# Patient Record
Sex: Female | Born: 1935 | Race: Black or African American | Hispanic: No | Marital: Single | Smoking: Former smoker
Health system: Southern US, Community
[De-identification: ages and names within clinical notes are randomized; demographics above are authoritative.]

## PROBLEM LIST (undated history)

## (undated) DIAGNOSIS — H9190 Unspecified hearing loss, unspecified ear: Secondary | ICD-10-CM

## (undated) DIAGNOSIS — A630 Anogenital (venereal) warts: Secondary | ICD-10-CM

## (undated) DIAGNOSIS — H547 Unspecified visual loss: Secondary | ICD-10-CM

## (undated) DIAGNOSIS — K219 Gastro-esophageal reflux disease without esophagitis: Secondary | ICD-10-CM

## (undated) DIAGNOSIS — F32A Depression, unspecified: Secondary | ICD-10-CM

## (undated) DIAGNOSIS — J449 Chronic obstructive pulmonary disease, unspecified: Secondary | ICD-10-CM

## (undated) DIAGNOSIS — I10 Essential (primary) hypertension: Secondary | ICD-10-CM

## (undated) DIAGNOSIS — E559 Vitamin D deficiency, unspecified: Secondary | ICD-10-CM

## (undated) DIAGNOSIS — F329 Major depressive disorder, single episode, unspecified: Secondary | ICD-10-CM

## (undated) DIAGNOSIS — R413 Other amnesia: Principal | ICD-10-CM

## (undated) DIAGNOSIS — H409 Unspecified glaucoma: Secondary | ICD-10-CM

## (undated) DIAGNOSIS — N9089 Other specified noninflammatory disorders of vulva and perineum: Secondary | ICD-10-CM

## (undated) HISTORY — DX: Unspecified hearing loss, unspecified ear: H91.90

## (undated) HISTORY — DX: Anogenital (venereal) warts: A63.0

## (undated) HISTORY — DX: Gastro-esophageal reflux disease without esophagitis: K21.9

## (undated) HISTORY — DX: Other amnesia: R41.3

## (undated) HISTORY — DX: Unspecified visual loss: H54.7

## (undated) HISTORY — DX: Other specified noninflammatory disorders of vulva and perineum: N90.89

## (undated) HISTORY — DX: Chronic obstructive pulmonary disease, unspecified: J44.9

## (undated) HISTORY — DX: Major depressive disorder, single episode, unspecified: F32.9

## (undated) HISTORY — DX: Depression, unspecified: F32.A

## (undated) HISTORY — PX: CHOLECYSTECTOMY: SHX55

## (undated) HISTORY — DX: Essential (primary) hypertension: I10

## (undated) HISTORY — DX: Unspecified glaucoma: H40.9

## (undated) HISTORY — PX: EYE SURGERY: SHX253

## (undated) HISTORY — DX: Vitamin D deficiency, unspecified: E55.9

---

## 2007-01-16 ENCOUNTER — Encounter (INDEPENDENT_AMBULATORY_CARE_PROVIDER_SITE_OTHER): Payer: Self-pay | Admitting: Surgery

## 2007-01-16 ENCOUNTER — Ambulatory Visit (HOSPITAL_COMMUNITY): Admission: RE | Admit: 2007-01-16 | Discharge: 2007-01-17 | Payer: Self-pay | Admitting: Surgery

## 2007-10-16 ENCOUNTER — Ambulatory Visit (HOSPITAL_COMMUNITY): Admission: RE | Admit: 2007-10-16 | Discharge: 2007-10-16 | Payer: Self-pay | Admitting: Family Medicine

## 2009-04-11 ENCOUNTER — Emergency Department (HOSPITAL_COMMUNITY): Admission: EM | Admit: 2009-04-11 | Discharge: 2009-04-11 | Payer: Self-pay | Admitting: Emergency Medicine

## 2010-08-17 LAB — URINALYSIS, ROUTINE W REFLEX MICROSCOPIC
Bilirubin Urine: NEGATIVE
Glucose, UA: NEGATIVE mg/dL
Hgb urine dipstick: NEGATIVE
Specific Gravity, Urine: 1.009 (ref 1.005–1.030)
Urobilinogen, UA: 0.2 mg/dL (ref 0.0–1.0)

## 2010-08-17 LAB — POCT CARDIAC MARKERS: CKMB, poc: 1.9 ng/mL (ref 1.0–8.0)

## 2010-09-27 NOTE — Op Note (Signed)
NAMESYRIA, KESTNER             ACCOUNT NO.:  [id number]   MEDICAL RECORD NO.:  [id number]          PATIENT TYPE:  AMB   LOCATION:  DAY                          FACILITY:  [facility]   PHYSICIAN:  Thornton Park. Daphine Deutscher, MD  DATE OF BIRTH:  Oct 01, 1930   DATE OF PROCEDURE:  01/16/2007  DATE OF DISCHARGE:                               OPERATIVE REPORT   PREOPERATIVE DIAGNOSIS:  Chronic cholecystitis.   POSTOPERATIVE DIAGNOSIS:  Chronic cholecystitis, gigantic gallbladder  with Phrygian cap and filled with large faceted stones, and sludge in  the common bile duct.   PROCEDURE:  Laparoscopic cholecystectomy with intraoperative  cholangiogram.   SURGEON:  Thornton Park. Daphine Deutscher, M.D.   ASSISTANT:  Kristine Garbe. Ezzard Standing, M.D.   ANESTHESIA:  General endotracheal.   DESCRIPTION OF PROCEDURE:  Ms. Charlisa Cham was taken to room one on  the afternoon of September 3 and given general anesthesia.  The abdomen  was prepped with [employer] and draped sterilely.  A longitudinal  incision was made down on the umbilicus.  I entered the abdomen without  difficulty using Hassan technique.  The abdomen was insufflated. Three  trocars using [employer] trocars were placed in the upper abdomen.  The gallbladder was grasped and taken back up over the liver.  We had a  large redundancy and probably one of the longest and largest  gallbladders I have ever seen.  I dissected free Calot's triangle, put a  clip on the cystic duct, incised it, did a Reddick dynamic cholangiogram  showing some sludge which I subsequently milked back out.  I then triple  clipped the cystic duct, detached the gallbladder, clipped the cystic  artery, and removed it from the gallbladder bed with some difficulty  owing to its low redundancy and length.  It was flopped up on top of the  liver and was largely intrahepatic in that location.  I transected and  clipped a vessel along the back portion of the fundus.  No bleeding or  bile  leaks were seen.  The gallbladder was placed in a bag but it did  not completely fit in the bag as it was too big and I brought it out  through the umbilicus which I had enlarged with my incision.  The  umbilical port  was then repaired under laparoscopic vision with interrupted simple  sutures of  0 Prolene.  The abdomen was deflated. The wounds were closed  with 4-0 Vicryl, Benzoin, and Steri-Strips.  The patient tolerated the  procedure well and was taken to the recovery room in satisfactory  condition.      Thornton Park Daphine Deutscher, MD  Electronically Signed     MBM/MEDQ  D:  01/16/2007  T:  01/16/2007  Job:  [id number]   cc:   Windle Guard, M.D.  Fax: [phone]   Llana Aliment. Malon Kindle., M.D.  Fax: [phone]

## 2010-12-17 ENCOUNTER — Emergency Department (HOSPITAL_COMMUNITY): Payer: Medicare Other

## 2010-12-17 ENCOUNTER — Emergency Department (HOSPITAL_COMMUNITY)
Admission: EM | Admit: 2010-12-17 | Discharge: 2010-12-17 | Disposition: A | Discharge status: Home / Self Care | Payer: Medicare Other | Attending: Emergency Medicine | Admitting: Emergency Medicine

## 2010-12-17 DIAGNOSIS — F068 Other specified mental disorders due to known physiological condition: Secondary | ICD-10-CM | POA: Insufficient documentation

## 2010-12-17 DIAGNOSIS — Z7382 Dual sensory impairment: Secondary | ICD-10-CM | POA: Insufficient documentation

## 2010-12-17 DIAGNOSIS — H9190 Unspecified hearing loss, unspecified ear: Secondary | ICD-10-CM | POA: Insufficient documentation

## 2010-12-17 DIAGNOSIS — R42 Dizziness and giddiness: Secondary | ICD-10-CM | POA: Insufficient documentation

## 2010-12-17 DIAGNOSIS — R5381 Other malaise: Secondary | ICD-10-CM | POA: Insufficient documentation

## 2010-12-17 DIAGNOSIS — H543 Unqualified visual loss, both eyes: Secondary | ICD-10-CM | POA: Insufficient documentation

## 2010-12-17 DIAGNOSIS — I10 Essential (primary) hypertension: Secondary | ICD-10-CM | POA: Insufficient documentation

## 2010-12-17 DIAGNOSIS — F209 Schizophrenia, unspecified: Secondary | ICD-10-CM | POA: Insufficient documentation

## 2010-12-17 LAB — URINALYSIS, ROUTINE W REFLEX MICROSCOPIC
Glucose, UA: NEGATIVE mg/dL
Ketones, ur: NEGATIVE mg/dL
Leukocytes, UA: NEGATIVE
Protein, ur: NEGATIVE mg/dL
Urobilinogen, UA: 0.2 mg/dL (ref 0.0–1.0)

## 2010-12-17 LAB — CBC
HCT: 37.4 % (ref 36.0–46.0)
Hemoglobin: 12.7 g/dL (ref 12.0–15.0)
MCH: 30.1 pg (ref 26.0–34.0)
MCHC: 34 g/dL (ref 30.0–36.0)
MCV: 88.6 fL (ref 78.0–100.0)
RBC: 4.22 MIL/uL (ref 3.87–5.11)

## 2010-12-17 LAB — DIFFERENTIAL
Basophils Relative: 1 % (ref 0–1)
Lymphocytes Relative: 27 % (ref 12–46)
Lymphs Abs: 1.9 10*3/uL (ref 0.7–4.0)
Monocytes Absolute: 0.6 10*3/uL (ref 0.1–1.0)
Monocytes Relative: 8 % (ref 3–12)
Neutro Abs: 4.4 10*3/uL (ref 1.7–7.7)

## 2010-12-17 LAB — COMPREHENSIVE METABOLIC PANEL
BUN: 17 mg/dL (ref 6–23)
CO2: 28 mEq/L (ref 19–32)
Calcium: 10.1 mg/dL (ref 8.4–10.5)
Creatinine, Ser: 0.97 mg/dL (ref 0.50–1.10)
GFR calc Af Amer: >60 mL/min (ref >60)
GFR calc non Af Amer: 56 mL/min — ABNORMAL LOW (ref >60)
Glucose, Bld: 97 mg/dL (ref 70–99)
Total Protein: 7.5 g/dL (ref 6.0–8.3)

## 2010-12-17 LAB — LIPASE, BLOOD: Lipase: 14 U/L (ref 11–59)

## 2010-12-17 LAB — URINE MICROSCOPIC-ADD ON

## 2010-12-18 LAB — URINE CULTURE
Colony Count: NO GROWTH
Culture  Setup Time: 201208041755

## 2011-02-24 LAB — HEMOGLOBIN AND HEMATOCRIT, BLOOD
HCT: 37
Hemoglobin: 12.7

## 2011-02-24 LAB — BASIC METABOLIC PANEL
CO2: 24
Calcium: 10.1
Creatinine, Ser: 0.99
GFR calc Af Amer: >60
Sodium: 141

## 2012-01-11 ENCOUNTER — Other Ambulatory Visit: Payer: Self-pay

## 2012-07-31 ENCOUNTER — Ambulatory Visit: Payer: Self-pay | Admitting: Obstetrics & Gynecology

## 2012-08-14 ENCOUNTER — Ambulatory Visit (INDEPENDENT_AMBULATORY_CARE_PROVIDER_SITE_OTHER): Payer: Medicare Other | Admitting: Obstetrics & Gynecology

## 2012-08-14 ENCOUNTER — Encounter: Payer: Self-pay | Admitting: Obstetrics & Gynecology

## 2012-08-14 VITALS — BP 127/81 | HR 53 | Temp 99.1°F | Wt 128.0 lb

## 2012-08-14 DIAGNOSIS — A630 Anogenital (venereal) warts: Secondary | ICD-10-CM

## 2012-08-14 NOTE — Progress Notes (Signed)
Subjective:     Catherine Howell is a 79 y.o. female here for a routine exam.  Current complaints: none.       Obstetric History OB History   Grav Para Term Preterm Abortions TAB SAB Ect Mult Living                   The following portions of the patient's history were reviewed and updated as appropriate: allergies, current medications, past family history, past medical history, past social history, past surgical history and problem list.  Review of Systems Pertinent items are noted in HPI.    Objective:    Pelvic: Left perienum, nodule x 1-- 5 mm    Assessment:   H/O condylomata; essentially normal exam  Plan:    Follow up in: 6 months.

## 2012-08-14 NOTE — Patient Instructions (Signed)
Disorders of the Vulva It is important to know the outside (external) area of the female genitalia to properly examine yourself. The vulva or labia, sometimes also called the lips around the vagina, covers the pubic bone and surrounds the vagina. The larger or outer labia is called the labia majora. The inner or smaller labia is called the labia minora. The clitoris is at the top of the vulva. It is covered by a small area of tissue called the hood. Below the clitoris is the opening of the tube from the bladder, which is the urethral opening. Just below the urethral opening is the opening of the vagina. This area is called the vestibule. Inside the vestibule are bartholin and skene glands that lubricate the outside of the vagina during sexual intercourse. The perineum is the area that lies between the vagina and anus. You should examine your external genitalia once a month just as you do your self breast examination. SIGNS AND SYMPTOMS TO LOOK FOR DURING YOUR EXAMINATION:  Swelling.  Redness.  Bumps.  Blisters.  Black or white areas.  Itching.  Bleeding.  Burning. TYPES OF VULVAR PROBLEMS  Infections.  Yeast (fungus) is the most common infection that causes redness, swelling, itching and a thick white vaginal discharge. Women with diabetes, taking medicines that kill germs (antibiotics) or on birth control pills are at risk for yeast infection. This infection is treated with vaginal creams, suppositories and anti-itch cream for the vulva.  Genital warts (condyloma) are caused by the human papilloma virus. They are raised bumps that can itch, bleed, cause discomfort and sometimes appear in groups like cauliflower. This is a sexually transmitted disease (STD) caused by sexual contact. This can be treated with topical medication, freezing, electrocautery, laser or surgical removal.  Genital herpes is a virus that causes redness, swelling, blisters and ulcers that can cause itching and are  very painful. This is also a STD. There are medications to control the symptoms of herpes, but there is no cure. Once you have it, you keep getting it because the virus stays in your body.  Contact dermatitis. Contact dermatitis is an irritation of the vulva that can cause redness, swelling and itching. It can be caused by:  Perfumed toilet paper.  Deodorants.  Talcum powder.  Hygiene sprays.  Tampons.  Soaps and fabric softeners.  Wearing wet underwear and bathing suits too long.  Spermicide.  Condoms.  Diaphragms.  Poison ivy. Treatment will depend on eliminating the cause and treating the symptoms.  Vulvodynia.  Vulvodynia is "painful vulva." It includes burning, itching, irritation and a feeling of rawness or bruising of the vulva. Treating this problem depends on the cause and diagnosis. Treatment can take a long time.  Vulvar Dystrophy.  Vulvar Dystrophy is a disorder of the skin of the vulva. The skin can be too thick with hard raised patches or too thin skin showing wrinkles. Vulvar dystrophy can cause redness or white patches, itching and burning sensation. A biopsy may be needed to diagnose the problem. Treatment with creams or ointments depends on the diagnosis.  Vulvar Cancer.  Vulvar cancer is usually a form of squamus skin cancer. Other types of vulvar cancer like melenoma (a dark or black, irregular shaped mole that can bleed easily) and adenocarcinoma (red, itchy, scaly area that looks like eczema) are rare. Treatment is usually surgery to remove the cancerous area, the vulva and the lymph nodes in the groin. This can be done with or without radiation and chemotherapy. HOME   CARE INSTRUCTIONS   Look at your vulva and external genital area every month.  Follow and finish all treatment given to you by your caregiver.  Do not use perfumed or scented soaps, detergents, hygiene spray, talcum powder, tampons, douches or toilet paper.  Remove your tampon every 6  hours. Do not leave tampons in overnight.  Wear loose-fitting clothing.  Wear underwear that is cotton.  Avoid spermicidal creams or foams that irritate you. SEEK MEDICAL CARE IF:   You notice changes on your vulva such as redness, swelling, itching, color changes, bleeding, small bumps, blisters or any discomfort.  You develop an abnormal vaginal discharge.  You have painful sexual intercourse.  You notice swelling of the lymph nodes in your groin. Document Released: 10/18/2007 Document Revised: 07/24/2011 Document Reviewed: 08/01/2010 [employer] Patient Information 2013 [employer].  

## 2012-10-03 ENCOUNTER — Ambulatory Visit (INDEPENDENT_AMBULATORY_CARE_PROVIDER_SITE_OTHER): Payer: Medicare Other | Admitting: Neurology

## 2012-10-03 ENCOUNTER — Encounter: Payer: Self-pay | Admitting: Neurology

## 2012-10-03 VITALS — BP 124/70 | HR 61 | Ht <= 58 in | Wt 130.0 lb

## 2012-10-03 DIAGNOSIS — D518 Other vitamin B12 deficiency anemias: Secondary | ICD-10-CM

## 2012-10-03 DIAGNOSIS — R6889 Other general symptoms and signs: Secondary | ICD-10-CM

## 2012-10-03 DIAGNOSIS — R413 Other amnesia: Secondary | ICD-10-CM

## 2012-10-03 HISTORY — DX: Other amnesia: R41.3

## 2012-10-03 NOTE — Progress Notes (Signed)
Reason for visit: Memory disorder  Catherine Howell is a 79 y.o. female  History of present illness:  Catherine Howell is a 79 year old right-handed black female with a history of glaucoma associated with blindness. The patient has been in an assisted living situation since June of 2007. The patient herself has reported some issues with memory that dates back about 3 years. The patient has some problems remembering names for people or recent events. The patient requires assistance with most of her activities of daily living such as bathing or dressing. The patient is able to feed herself. The patient walks without a cane. The patient does have some problems with constipation and urinary incontinence. The patient denies headache, or numbness or weakness of the face, arms, or legs. The patient does report some dizziness. The patient is sent to this office for evaluation of her memory problems.  Past Medical History  Diagnosis Date  . Blind   . Condyloma acuminatum   . 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 Hypertension   . Glaucoma   . Memory deficits 10/03/2012  . 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 Hearing deficit   . Vitamin D deficiency   . Gastroesophageal reflux disease   . COPD (chronic obstructive pulmonary disease)   . Depression     Past Surgical History  Procedure Laterality Date  . Cholecystectomy    . Eye surgery      History reviewed. No pertinent family history.  Social history:  reports that she has quit smoking. She does not have any smokeless tobacco history on file. She reports that she does not drink alcohol or use illicit drugs.  Medications:  Current Outpatient Prescriptions on File Prior to Visit  Medication Sig Dispense Refill  . Acetaminophen 650 MG TABS Take 2 tablets by mouth every 12 (twelve) hours as needed (for pain).      Marland Kitchen albuterol (PROAIR HFA) 108 (90 BASE) MCG/ACT inhaler Inhale 2 puffs into the lungs every 4 (four)  hours as needed for wheezing.      Marland Kitchen aspirin 81 MG tablet Take 81 mg by mouth daily.      . beclomethasone (QVAR) 80 MCG/ACT inhaler Inhale 1-3 puffs into the lungs every 12 (twelve) hours.      . docusate calcium (SURFAK) 240 MG capsule Take 240 mg by mouth daily.      . haloperidol (HALDOL) 0.5 MG tablet Take 0.5 mg by mouth at bedtime.      Marland Kitchen lisinopril (PRINIVIL,ZESTRIL) 10 MG tablet Take 10 mg by mouth daily.      Marland Kitchen loteprednol (LOTEMAX) 0.5 % ophthalmic suspension Place 1 drop into the left eye daily.      . meclizine (ANTIVERT) 25 MG tablet Take 25 mg by mouth every 6 (six) hours as needed.      Marland Kitchen ofloxacin (OCUFLOX) 0.3 % ophthalmic solution Place 1 drop into both eyes 3 (three) times daily.      . ranitidine (ZANTAC) 300 MG tablet Take 300 mg by mouth at bedtime.      . sertraline (ZOLOFT) 50 MG tablet Take 50 mg by mouth at bedtime.      . sodium chloride (MURO 128) 5 % ophthalmic solution Place 1 drop into the left eye 3 (three) times daily.      . Vitamin D, Ergocalciferol, (DRISDOL) 50000 UNITS CAPS Take 50,000 Units by mouth every 14 (fourteen) days.      Marland Kitchen loratadine (  CLARITIN) 10 MG tablet Take 10 mg by mouth daily.      . Menthol-Methyl Salicylate (GRX ANALGESIC BALM) OINT Apply topically every 12 (twelve) hours as needed.       No current facility-administered medications on file prior to visit.    Allergies: No Known Allergies  ROS:  Out of a complete 14 system review of symptoms, the patient complains only of the following symptoms, and all other reviewed systems are negative.  Hearing loss, ringing in the ears Blindness, eye pain Incontinence of bladder Constipation Memory disturbance  Blood pressure 124/70, pulse 61, height 4\' 10"  (1.473 m), weight 130 lb (58.968 kg).  Physical Exam  General: The patient is alert and cooperative at the time of the examination.  Head: Pupils are round and reactive on the right, disc is flat. Not visualized on the  left.  Neck: The neck is supple, no carotid bruits are noted.  Respiratory: The respiratory examination is clear.  Cardiovascular: The cardiovascular examination reveals a regular rate and rhythm, no obvious murmurs or rubs are noted.  Skin: Extremities are without significant edema.  Neurologic Exam  Mental status: Mini-Mental status examination done today shows a total score of 18/25. The patient was not able to participate in some of the testing secondary to blindness. The patient is able to name 10 animals in 60 seconds.  Cranial nerves: Facial symmetry is present. Ptosis is noted on the left. There is good sensation of the face to pinprick and soft touch bilaterally. The strength of the facial muscles and the muscles to head turning and shoulder shrug are normal bilaterally. Speech is well enunciated, no aphasia or dysarthria is noted. Extraocular movements are full. The patient can only see some moving objects, she cannot count fingers.  Motor: The motor testing reveals 5 over 5 strength of all 4 extremities. Good symmetric motor tone is noted throughout.  Sensory: Sensory testing is intact to pinprick, soft touch, vibration sensation, and position sense on all 4 extremities. No evidence of extinction is noted.  Coordination: Cerebellar testing reveals good finger-nose-finger and heel-to-shin bilaterally.  Gait and station: Gait is slightly wide-based, unsteady. Tandem gait is not tested. Romberg is negative. No drift is seen.  Reflexes: Deep tendon reflexes are symmetric and normal bilaterally. Toes are downgoing bilaterally.   Assessment/Plan:  One. Reported memory disturbance  2. Blindness  The patient has noted some changes in her memory that have occurred over the last 3 years. The patient cognitively, however, is still doing fairly well. At this point, we will check blood work, and check a CT scan of the brain. The patient will be followed over time for her memory issues.  If significant progression is noted, medications for memory will be started. The patient will followup in 6 months.  Marlan Palau MD 10/04/2012 10:51 AM  [facility] [address] [place], [place] [address]  Phone [phone] Fax [phone]

## 2012-10-04 ENCOUNTER — Telehealth: Payer: Self-pay | Admitting: *Deleted

## 2012-10-04 LAB — RPR: RPR: NONREACTIVE

## 2012-10-04 NOTE — Telephone Encounter (Signed)
Message copied by Salome Spotted on Fri Oct 04, 2012  1:40 PM ------      Message from: Stephanie Acre      Created: Fri Oct 04, 2012  9:36 AM       Please call the patient. The blood work results are unremarkable. Thank you.            ----- Message -----         From: [facility] Lab Results In Interface         Sent: 10/04/2012   5:49 AM           To: York Spaniel, MD                   ------

## 2012-10-04 NOTE — Telephone Encounter (Signed)
Called patient left voice massage letting them know there results are ready. When call back send to pool.

## 2012-10-09 NOTE — Progress Notes (Signed)
Quick Note:  Called pt and spoke to [place], her caregiver and gave the results of labs (normal) to her. Fax to her at this home # and also to Dr. Jeannetta Nap. (fax [phone]). [phone]. Done. ______

## 2012-10-09 NOTE — Progress Notes (Signed)
Quick Note:  I called pt and spoke to [facility], her caregiver and gave the results of labs (normal) to her. Faxed to her at home # and also to Dr. Jeannetta Nap. Done. [phone]. ______

## 2012-10-16 ENCOUNTER — Telehealth: Payer: Self-pay | Admitting: Neurology

## 2012-10-16 ENCOUNTER — Ambulatory Visit
Admission: RE | Admit: 2012-10-16 | Discharge: 2012-10-16 | Disposition: A | Discharge status: Home / Self Care | Payer: Medicare Other | Source: Ambulatory Visit | Attending: Neurology | Admitting: Neurology

## 2012-10-16 DIAGNOSIS — R413 Other amnesia: Secondary | ICD-10-CM

## 2012-10-16 NOTE — Telephone Encounter (Signed)
I called the caretaker's. The CT of the brain shows minimal small vessel disease, right basal ganglia involvement. The small vessel disease is not extensive enough to result in significant memory issues.

## 2013-04-07 ENCOUNTER — Telehealth: Payer: Self-pay | Admitting: Neurology

## 2013-04-07 ENCOUNTER — Ambulatory Visit: Payer: Medicare Other | Admitting: Neurology

## 2013-04-07 NOTE — Telephone Encounter (Signed)
The patient did not show up for the appointment today. 

## 2013-12-11 ENCOUNTER — Encounter (HOSPITAL_COMMUNITY): Payer: Self-pay | Admitting: Emergency Medicine

## 2013-12-11 ENCOUNTER — Emergency Department (INDEPENDENT_AMBULATORY_CARE_PROVIDER_SITE_OTHER)
Admission: EM | Admit: 2013-12-11 | Discharge: 2013-12-11 | Disposition: A | Discharge status: Nursing Home (Includes ICF) | Payer: Medicare Other | Source: Home / Self Care | Attending: Family Medicine | Admitting: Family Medicine

## 2013-12-11 ENCOUNTER — Observation Stay (HOSPITAL_COMMUNITY)
Admission: EM | Admit: 2013-12-11 | Discharge: 2013-12-13 | Payer: Medicare Other | Attending: Internal Medicine | Admitting: Internal Medicine

## 2013-12-11 ENCOUNTER — Emergency Department (HOSPITAL_COMMUNITY): Payer: Medicare Other

## 2013-12-11 ENCOUNTER — Other Ambulatory Visit: Payer: Self-pay

## 2013-12-11 DIAGNOSIS — H409 Unspecified glaucoma: Secondary | ICD-10-CM | POA: Diagnosis present

## 2013-12-11 DIAGNOSIS — F03918 Unspecified dementia, unspecified severity, with other behavioral disturbance: Secondary | ICD-10-CM | POA: Insufficient documentation

## 2013-12-11 DIAGNOSIS — F0391 Unspecified dementia with behavioral disturbance: Secondary | ICD-10-CM

## 2013-12-11 DIAGNOSIS — K219 Gastro-esophageal reflux disease without esophagitis: Secondary | ICD-10-CM | POA: Diagnosis not present

## 2013-12-11 DIAGNOSIS — I10 Essential (primary) hypertension: Secondary | ICD-10-CM | POA: Diagnosis present

## 2013-12-11 DIAGNOSIS — F3289 Other specified depressive episodes: Secondary | ICD-10-CM | POA: Diagnosis not present

## 2013-12-11 DIAGNOSIS — E860 Dehydration: Principal | ICD-10-CM | POA: Diagnosis present

## 2013-12-11 DIAGNOSIS — H5440 Blindness, one eye, unspecified eye: Secondary | ICD-10-CM | POA: Diagnosis not present

## 2013-12-11 DIAGNOSIS — A630 Anogenital (venereal) warts: Secondary | ICD-10-CM

## 2013-12-11 DIAGNOSIS — G9341 Metabolic encephalopathy: Secondary | ICD-10-CM | POA: Diagnosis present

## 2013-12-11 DIAGNOSIS — Z79899 Other long term (current) drug therapy: Secondary | ICD-10-CM | POA: Insufficient documentation

## 2013-12-11 DIAGNOSIS — Z7982 Long term (current) use of aspirin: Secondary | ICD-10-CM | POA: Insufficient documentation

## 2013-12-11 DIAGNOSIS — J449 Chronic obstructive pulmonary disease, unspecified: Secondary | ICD-10-CM | POA: Diagnosis not present

## 2013-12-11 DIAGNOSIS — F22 Delusional disorders: Secondary | ICD-10-CM

## 2013-12-11 DIAGNOSIS — E559 Vitamin D deficiency, unspecified: Secondary | ICD-10-CM | POA: Diagnosis not present

## 2013-12-11 DIAGNOSIS — F329 Major depressive disorder, single episode, unspecified: Secondary | ICD-10-CM | POA: Insufficient documentation

## 2013-12-11 DIAGNOSIS — H547 Unspecified visual loss: Secondary | ICD-10-CM | POA: Diagnosis present

## 2013-12-11 DIAGNOSIS — R404 Transient alteration of awareness: Secondary | ICD-10-CM | POA: Diagnosis present

## 2013-12-11 DIAGNOSIS — R413 Other amnesia: Secondary | ICD-10-CM | POA: Diagnosis present

## 2013-12-11 DIAGNOSIS — J410 Simple chronic bronchitis: Secondary | ICD-10-CM

## 2013-12-11 DIAGNOSIS — J4489 Other specified chronic obstructive pulmonary disease: Secondary | ICD-10-CM | POA: Insufficient documentation

## 2013-12-11 DIAGNOSIS — R630 Anorexia: Secondary | ICD-10-CM

## 2013-12-11 DIAGNOSIS — R410 Disorientation, unspecified: Secondary | ICD-10-CM

## 2013-12-11 LAB — POCT URINALYSIS DIP (DEVICE)
GLUCOSE, UA: NEGATIVE mg/dL
KETONES UR: 80 mg/dL — AB
Leukocytes, UA: NEGATIVE
Nitrite: NEGATIVE
Protein, ur: 30 mg/dL — AB
Urobilinogen, UA: 1 mg/dL (ref 0.0–1.0)
pH: 5.5 (ref 5.0–8.0)

## 2013-12-11 LAB — COMPREHENSIVE METABOLIC PANEL
ALK PHOS: 73 U/L (ref 39–117)
ALT: 10 U/L (ref 0–35)
ANION GAP: 21 — AB (ref 5–15)
AST: 20 U/L (ref 0–37)
Albumin: 3.9 g/dL (ref 3.5–5.2)
BUN: 20 mg/dL (ref 6–23)
CHLORIDE: 101 meq/L (ref 96–112)
CO2: 19 meq/L (ref 19–32)
Calcium: 9.2 mg/dL (ref 8.4–10.5)
Creatinine, Ser: 0.97 mg/dL (ref 0.50–1.10)
GFR calc Af Amer: 62 mL/min — ABNORMAL LOW (ref >90)
GFR, EST NON AFRICAN AMERICAN: 54 mL/min — AB (ref >90)
Glucose, Bld: 75 mg/dL (ref 70–99)
POTASSIUM: 3.8 meq/L (ref 3.7–5.3)
Sodium: 141 mEq/L (ref 137–147)
Total Bilirubin: 0.8 mg/dL (ref 0.3–1.2)
Total Protein: 7.1 g/dL (ref 6.0–8.3)

## 2013-12-11 LAB — URINALYSIS, ROUTINE W REFLEX MICROSCOPIC
Glucose, UA: NEGATIVE mg/dL
Hgb urine dipstick: NEGATIVE
Ketones, ur: >80 mg/dL — AB
Leukocytes, UA: NEGATIVE
Nitrite: NEGATIVE
PH: 5.5 (ref 5.0–8.0)
PROTEIN: NEGATIVE mg/dL
Specific Gravity, Urine: 1.022 (ref 1.005–1.030)
Urobilinogen, UA: 1 mg/dL (ref 0.0–1.0)

## 2013-12-11 LAB — CBC
HEMATOCRIT: 39.2 % (ref 36.0–46.0)
HEMOGLOBIN: 12.6 g/dL (ref 12.0–15.0)
MCH: 30.3 pg (ref 26.0–34.0)
MCHC: 32.1 g/dL (ref 30.0–36.0)
MCV: 94.2 fL (ref 78.0–100.0)
Platelets: 156 10*3/uL (ref 150–400)
RBC: 4.16 MIL/uL (ref 3.87–5.11)
RDW: 12.7 % (ref 11.5–15.5)
WBC: 7.1 10*3/uL (ref 4.0–10.5)

## 2013-12-11 LAB — I-STAT CG4 LACTIC ACID, ED: Lactic Acid, Venous: 0.96 mmol/L (ref 0.5–2.2)

## 2013-12-11 LAB — AMMONIA: Ammonia: 24 umol/L (ref 11–60)

## 2013-12-11 LAB — CBG MONITORING, ED: GLUCOSE-CAPILLARY: 95 mg/dL (ref 70–99)

## 2013-12-11 MED ORDER — FLUTICASONE PROPIONATE HFA 44 MCG/ACT IN AERO
1.0000 | INHALATION_SPRAY | Freq: Two times a day (BID) | RESPIRATORY_TRACT | Status: DC
Start: 2013-12-11 — End: 2013-12-13
  Administered 2013-12-12: 1 via RESPIRATORY_TRACT
  Filled 2013-12-11: qty 10.6

## 2013-12-11 MED ORDER — SODIUM CHLORIDE 0.9 % IV BOLUS (SEPSIS)
500.0000 mL | Freq: Once | INTRAVENOUS | Status: AC
  Administered 2013-12-11: 500 mL via INTRAVENOUS

## 2013-12-11 MED ORDER — ENOXAPARIN SODIUM 40 MG/0.4ML ~~LOC~~ SOLN
40.0000 mg | Freq: Every day | SUBCUTANEOUS | Status: DC
  Administered 2013-12-12: 40 mg via SUBCUTANEOUS
  Filled 2013-12-11 (×3): qty 0.4

## 2013-12-11 MED ORDER — THIAMINE HCL 100 MG/ML IJ SOLN
100.0000 mg | Freq: Every day | INTRAMUSCULAR | Status: DC
  Administered 2013-12-12: 100 mg via INTRAVENOUS
  Filled 2013-12-11 (×2): qty 1

## 2013-12-11 MED ORDER — LOTEPREDNOL ETABONATE 0.5 % OP SUSP
1.0000 [drp] | Freq: Every day | OPHTHALMIC | Status: DC
  Administered 2013-12-12: 1 [drp] via OPHTHALMIC
  Filled 2013-12-11: qty 5

## 2013-12-11 MED ORDER — DOCUSATE SODIUM 100 MG PO CAPS
100.0000 mg | ORAL_CAPSULE | Freq: Every day | ORAL | Status: DC
  Administered 2013-12-12: 100 mg via ORAL
  Filled 2013-12-11 (×3): qty 1

## 2013-12-11 MED ORDER — HALOPERIDOL 0.5 MG PO TABS
0.5000 mg | ORAL_TABLET | Freq: Two times a day (BID) | ORAL | Status: DC
  Administered 2013-12-12 – 2013-12-13 (×3): 0.5 mg via ORAL
  Filled 2013-12-11 (×5): qty 1

## 2013-12-11 MED ORDER — SODIUM CHLORIDE (HYPERTONIC) 5 % OP SOLN
1.0000 [drp] | Freq: Three times a day (TID) | OPHTHALMIC | Status: DC
  Administered 2013-12-12 – 2013-12-13 (×3): 1 [drp] via OPHTHALMIC
  Filled 2013-12-11 (×3): qty 15

## 2013-12-11 MED ORDER — TRAZODONE HCL 100 MG PO TABS
100.0000 mg | ORAL_TABLET | Freq: Every day | ORAL | Status: DC
  Administered 2013-12-12: 100 mg via ORAL
  Filled 2013-12-11 (×3): qty 1

## 2013-12-11 MED ORDER — FAMOTIDINE 10 MG PO TABS
10.0000 mg | ORAL_TABLET | Freq: Every day | ORAL | Status: DC
  Administered 2013-12-12 – 2013-12-13 (×2): 10 mg via ORAL
  Filled 2013-12-11 (×2): qty 1

## 2013-12-11 MED ORDER — LISINOPRIL 5 MG PO TABS
5.0000 mg | ORAL_TABLET | Freq: Every day | ORAL | Status: DC
  Administered 2013-12-12: 5 mg via ORAL
  Filled 2013-12-11 (×2): qty 1

## 2013-12-11 MED ORDER — SODIUM CHLORIDE 0.9 % IV SOLN
INTRAVENOUS | Status: AC
  Administered 2013-12-11: 23:00:00 via INTRAVENOUS

## 2013-12-11 MED ORDER — SERTRALINE HCL 25 MG PO TABS
25.0000 mg | ORAL_TABLET | Freq: Every day | ORAL | Status: DC
  Administered 2013-12-12: 25 mg via ORAL
  Filled 2013-12-11 (×3): qty 1

## 2013-12-11 MED ORDER — LORATADINE 10 MG PO TABS
10.0000 mg | ORAL_TABLET | Freq: Every day | ORAL | Status: DC
  Administered 2013-12-12: 10 mg via ORAL
  Filled 2013-12-11 (×2): qty 1

## 2013-12-11 NOTE — Progress Notes (Signed)
Attempted to call. No answer phone just kept ringing.

## 2013-12-11 NOTE — H&P (Signed)
PCP: Kaleen Mask, MD    Chief Complaint:  Confused not eating  HPI: Catherine Howell is a 80 y.o. 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   Patient started to become progressively more disoriented for the past 2 weeks and then 6 days ago stopped eating and recently stopped drinking. She resides as Assisted living at [facility]. She is confused at baseline but able to do some things although needs assistance with ADL's. She has not been complaining of any pain. Patient has no family her guardian is at bedside. Her guardian states that the patient is able to ambulate. Her by mouth intake has somewhat improved that she has eaten a bowl sleep today. She is still very confused. She was taken to urgent care today for father workup. UA did not show any evidence of urinary tract, CT scan unremarkable For any new findings and chest x-ray also stable from prior.   Procardia and she had one loose bowel movement today but otherwise no diarrhea. No fever,  no other complaints.   Hospitalist was called for admission for dehydration and confusion.   Review of Systems:    unable to obtain due to confusion  Past Medical History: Past Medical History  Diagnosis Date  . Blind   . Condyloma acuminatum   . 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 Hypertension   . Glaucoma   . Memory deficits 10/03/2012  . 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 Hearing deficit   . Vitamin D deficiency   . Gastroesophageal reflux disease   . COPD (chronic obstructive pulmonary disease)   . Depression    Past Surgical History  Procedure Laterality Date  . Cholecystectomy    . Eye surgery        Medications: Prior to Admission medications   Medication Sig Start Date End Date Taking? Authorizing Provider  acetaminophen (TYLENOL) 500 MG tablet Take 500 mg by mouth 2 (two) times daily.   Yes Historical Provider, MD  albuterol (PROAIR HFA) 108 (90 BASE) MCG/ACT inhaler Inhale 2 puffs into the lungs every 4 (four) hours as needed for wheezing.   Yes Historical Provider, MD  aspirin 81 MG tablet Take 81 mg by mouth daily.   Yes Historical Provider, MD  beclomethasone (QVAR) 80 MCG/ACT inhaler Inhale 1-3 puffs into the lungs every 12 (twelve) hours.   Yes Historical Provider, MD  Cholecalciferol (D3-1000) 1000 UNITS tablet Take 1,000 Units by mouth daily.   Yes Historical Provider, MD  docusate calcium (SURFAK) 240 MG capsule Take 240 mg by mouth daily.   Yes Historical Provider, MD  haloperidol (HALDOL) 0.5 MG tablet Take 0.5 mg by mouth 2 (two) times daily.   Yes Historical Provider, MD  lisinopril (PRINIVIL,ZESTRIL) 5 MG tablet Take 5 mg by mouth daily.   Yes Historical Provider, MD  loratadine (CLARITIN) 10 MG tablet Take 10 mg by mouth daily.   Yes Historical Provider, MD  loteprednol (LOTEMAX) 0.5 % ophthalmic suspension Place 1 drop into the left eye daily.   Yes Historical Provider, MD  meclizine (ANTIVERT) 25 MG tablet Take 25 mg by mouth every 6 (six) hours as needed.   Yes Historical Provider, MD  Menthol-Methyl Salicylate (GRX ANALGESIC BALM) OINT Apply topically every  12 (twelve) hours as needed.   Yes Historical Provider, MD  PRAMOX-PE-GLYCERIN-PETROLATUM RE Place 1 application rectally 2 (two) times daily.   Yes Historical Provider, MD  ranitidine (ZANTAC) 300 MG tablet Take 300 mg by mouth at bedtime.   Yes Historical Provider, MD  sertraline (ZOLOFT) 25 MG tablet Take 25 mg by mouth at bedtime.   Yes Historical Provider, MD  sodium chloride (MURO 128) 5 % ophthalmic solution Place 1 drop into the left eye 3 (three) times daily.   Yes Historical Provider, MD  traZODone  (DESYREL) 100 MG tablet Take 100 mg by mouth at bedtime.   Yes Historical Provider, MD    Allergies:  No Known Allergies  Social History:  Ambulatory   independently  [facility] Assisted living  [facility] [place] [place] [phone]   reports that she has quit smoking. She does not have any smokeless tobacco history on file. She reports that she does not drink alcohol or use illicit drugs.    Family History: family history is not on file. She was adopted.    Physical Exam: Patient Vitals for the past 24 hrs:  BP Temp Temp src Pulse Resp SpO2  12/11/13 2130 131/85 mmHg - - - 16 -  12/11/13 2115 - - - - - 99 %  12/11/13 2100 133/96 mmHg - - 96 - -  12/11/13 2030 142/101 mmHg - - 79 - -  12/11/13 2002 129/86 mmHg - - 75 22 95 %  12/11/13 2000 129/86 mmHg - - - - -  12/11/13 1933 134/81 mmHg - - 73 20 100 %  12/11/13 1930 134/81 mmHg - - - - -  12/11/13 1845 135/77 mmHg - - 70 - 100 %  12/11/13 1815 123/73 mmHg - - 71 15 96 %  12/11/13 1800 132/75 mmHg - - 74 16 100 %  12/11/13 1745 114/65 mmHg - - 82 18 99 %  12/11/13 1700 115/60 mmHg - - 65 24 97 %  12/11/13 1621 131/61 mmHg - - 65 18 99 %  12/11/13 1521 129/75 mmHg 97.1 F (36.2 C) Oral 62 18 100 %    1. General:  in No Acute distress 2. Psychological: Alertbut not  Oriented 3. Head/ENT:     Dry Mucous Membranes                          Head Non traumatic, neck supple                           Poor Dentition but no visible abscess  Or loose teeth Left eyelid closed due to purulent discharge 4. SKIN:   decreased Skin turgor,  Skin clean Dry and intact no rash 5. Heart: Regular rate and rhythm no Murmur, Rub or gallop 6. Lungs: Clear to auscultation bilaterally, no wheezes or crackles   7. Abdomen: Soft, non-tender, Non distended 8. Lower extremities: no clubbing, cyanosis, or edema 9. Neurologically Grossly intact, moving all 4 extremities equally 10. MSK: Normal range of motion.    body mass index is unknown because there is no weight on file.   Labs on Admission:   Recent Labs  12/11/13 1522  NA 141  K 3.8  CL 101  CO2 19  GLUCOSE 75  BUN 20  CREATININE 0.97  CALCIUM 9.2    Recent Labs  12/11/13 1522  AST 20  ALT 10  ALKPHOS 73  BILITOT 0.8  PROT 7.1  ALBUMIN 3.9   No results found for this basename: LIPASE, AMYLASE,  in the last 72 hours  Recent Labs  12/11/13 1522  WBC 7.1  HGB 12.6  HCT 39.2  MCV 94.2  PLT 156   No results found for this basename: CKTOTAL, CKMB, CKMBINDEX, TROPONINI,  in the last 72 hours No results found for this basename: TSH, T4TOTAL, FREET3, T3FREE, THYROIDAB,  in the last 72 hours No results found for this basename: VITAMINB12, FOLATE, FERRITIN, TIBC, IRON, RETICCTPCT,  in the last 72 hours No results found for this basename: HGBA1C    The CrCl is unknown because both a height and weight (above a minimum accepted value) are required for this calculation. ABG No results found for this basename: phart, pco2, po2, hco3, tco2, acidbasedef, o2sat     No results found for this basename: DDIMER     Other results:  I have pearsonaly reviewed this: ECG REPORT  Rate: 69  Rhythm: RBBB ST&T Change: no ischemia UA no evidence of infection  BNP (last 3 results) No results found for this basename: PROBNP,  in the last 8760 hours  There were no vitals filed for this visit.   Cultures:    Component Value Date/Time   SDES URINE, CLEAN CATCH 12/17/2010 1113   SPECREQUEST clean catch 12/17/2010 1113   CULT NO GROWTH 12/17/2010 1113   REPTSTATUS 12/18/2010 FINAL 12/17/2010 1113    Radiological Exams on Admission: Ct Head Wo Contrast  12/11/2013   CLINICAL DATA:  Altered mental status.  Paranoia.  Disorientation.  EXAM: CT HEAD WITHOUT CONTRAST  TECHNIQUE: Contiguous axial images were obtained from the base of the skull through the vertex without intravenous contrast.  COMPARISON:  10/16/2012  FINDINGS: No mass  lesion. No midline shift. No acute hemorrhage or hematoma. No extra-axial fluid collections. No evidence of acute infarction. There is severe cerebellar atrophy and moderate cerebral cortical atrophy without ventricular dilatation. There are old bilateral lacunar infarcts in the basal ganglia. No acute abnormalities. Osseous structures are normal. Retention cysts are present in the bases of both maxillary sinuses.  IMPRESSION: No acute intracranial abnormality. Diffuse atrophy. Old lacunar infarcts.   Electronically Signed   By: Geanie Cooley M.D.   On: 12/11/2013 19:17   Dg Chest Portable 1 View  12/11/2013   CLINICAL DATA:  Altered mental status.  EXAM: PORTABLE CHEST - 1 VIEW  COMPARISON:  12/17/2010.  FINDINGS: Mediastinum and hilar structures normal. Lungs are clear. Elevation of the left hemidiaphragm is noted. This is stable. No pleural effusion or pneumothorax. No acute bony abnormality. Degenerative changes both shoulders.  IMPRESSION: 1. Elevation left hemidiaphragm.  This is stable. 2. No acute cardiopulmonary disease.   Electronically Signed   By: Maisie Fus  Register   On: 12/11/2013 17:08    Chart has been reviewed  Assessment/Plan  80 year old female with past medical history of mild hypertension cardiomyopathy medications and dementia presents with progressively worsening confusion poor by mouth intake resulting in dehydration.  Present on Admission:  . Encephalopathy, metabolic - will give gentle IV fluids and see if she improves. This is likely progressive process. No evidence of infection was noted. Spoke with legal guardian who at this point wishes to avoid over aggressive measures. Okay with IV fluids but does not wish for any aggressive interventions. Deboraha Sprang guardian stated she does not wish for feeding tube placement.  . Dehydration - we'll give gentle give fluids and see the  patient improves Dementia slowly progressing. Check TSH and vitamin B 12 - patient will likely need increased  level of care she is progressively unable to take care of herself  Prophylaxis:   Lovenox  CODE STATUS:   DNR/DNI concentrating on noninvasive care  Other plan as per orders.  I have spent a total of 55 min on this admission  Kensley Valladares 12/11/2013, 9:40 PM  [facility]  Pager [phone]   If 7AM-7PM, please contact the day team taking care of the patient  [email or web address]  Password [email or web address]

## 2013-12-11 NOTE — Progress Notes (Signed)
[place] received from [place].

## 2013-12-11 NOTE — ED Notes (Signed)
Patient transported to CT 

## 2013-12-11 NOTE — ED Notes (Signed)
Attempted to do I&O but was unable to get any urine return. Difficulty with anatomy on pt. MD and RN made aware.

## 2013-12-11 NOTE — ED Notes (Signed)
Pt was sent from urgent care with concern for uti.  Pt family reports from AL and usually alert and oriented.  Pt has been disoriented, paranoid, not eating, and wondering around which is not usual for her.  Pt denies any pain

## 2013-12-11 NOTE — ED Notes (Signed)
Phlebotomy at the bedside. ED tech attempted lab draw, unsuccessful. Resident made aware

## 2013-12-11 NOTE — ED Notes (Signed)
Pt may eat, family getting soup from [employer] for pt.

## 2013-12-11 NOTE — ED Provider Notes (Signed)
CSN: [id number]     Arrival date & time 12/11/13  1449 History   First MD Initiated Contact with Patient 12/11/13 1613     Chief Complaint  Patient presents with  . Altered Mental Status  . Paranoid     (Consider location/radiation/quality/duration/timing/severity/associated sxs/prior Treatment) Patient is a 80 y.o. female presenting with altered mental status. The history is provided by the patient and a relative.  Altered Mental Status Presenting symptoms: behavior changes, confusion and disorientation   Severity:  Severe Most recent episode:  More than 2 days ago Episode history:  Continuous Duration:  4 days Timing:  Constant Progression:  Unchanged Chronicity:  New Context: dementia and nursing home resident   Associated symptoms: no abdominal pain, no fever, no headaches, no nausea, no palpitations and no vomiting   Associated symptoms comment:  Decreased oral intake last four days    The patient is a 80 year old female presenting with delirium. Patient for the past 4 days not eating or drinking anything per her assisted living facility. Has been saying that there are people around who planting bombs. Patient went to urgent care with concern for potential UTI. Urine negative for UTI however very concentrated. Sent here for evaluation. Denies any fevers chills chest pain shortness of breath denies any head injuries.  Past Medical History  Diagnosis Date  . Blind   . Condyloma acuminatum   . 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 Hypertension   . Glaucoma   . Memory deficits 10/03/2012  . 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 Hearing deficit   . Vitamin D deficiency   . Gastroesophageal reflux disease   . COPD (chronic obstructive pulmonary disease)   . Depression    Past Surgical History  Procedure Laterality Date  . Cholecystectomy    . Eye surgery     No family history on file. History  Substance Use Topics  . Smoking status:  Former Games developer  . Smokeless tobacco: Not on file  . Alcohol Use: No   OB History   Grav Para Term Preterm Abortions TAB SAB Ect Mult Living                 Review of Systems  Constitutional: Negative for fever and chills.  HENT: Negative for congestion and rhinorrhea.   Eyes: Negative for redness and visual disturbance.  Respiratory: Negative for shortness of breath and wheezing.   Cardiovascular: Negative for chest pain and palpitations.  Gastrointestinal: Negative for nausea, vomiting and abdominal pain.  Genitourinary: Negative for dysuria and urgency.  Musculoskeletal: Negative for arthralgias and myalgias.  Skin: Negative for pallor and wound.  Neurological: Negative for dizziness and headaches.  Psychiatric/Behavioral: Positive for confusion.      Allergies  Review of patient's allergies indicates no known allergies.  Home Medications   Prior to Admission medications   Medication Sig Start Date End Date Taking? Authorizing Provider  acetaminophen (TYLENOL) 500 MG tablet Take 500 mg by mouth 2 (two) times daily.   Yes Historical Provider, MD  albuterol (PROAIR HFA) 108 (90 BASE) MCG/ACT inhaler Inhale 2 puffs into the lungs every 4 (four) hours as needed for wheezing.   Yes Historical Provider, MD  aspirin 81 MG tablet Take 81 mg by mouth daily.   Yes Historical Provider, MD  beclomethasone (QVAR) 80 MCG/ACT inhaler Inhale 1-3 puffs into the lungs every 12 (twelve) hours.   Yes Historical Provider, MD  Cholecalciferol (D3-1000) 1000 UNITS tablet  Take 1,000 Units by mouth daily.   Yes Historical Provider, MD  docusate calcium (SURFAK) 240 MG capsule Take 240 mg by mouth daily.   Yes Historical Provider, MD  haloperidol (HALDOL) 0.5 MG tablet Take 0.5 mg by mouth 2 (two) times daily.   Yes Historical Provider, MD  lisinopril (PRINIVIL,ZESTRIL) 5 MG tablet Take 5 mg by mouth daily.   Yes Historical Provider, MD  loratadine (CLARITIN) 10 MG tablet Take 10 mg by mouth daily.    Yes Historical Provider, MD  loteprednol (LOTEMAX) 0.5 % ophthalmic suspension Place 1 drop into the left eye daily.   Yes Historical Provider, MD  meclizine (ANTIVERT) 25 MG tablet Take 25 mg by mouth every 6 (six) hours as needed.   Yes Historical Provider, MD  Menthol-Methyl Salicylate (GRX ANALGESIC BALM) OINT Apply topically every 12 (twelve) hours as needed.   Yes Historical Provider, MD  PRAMOX-PE-GLYCERIN-PETROLATUM RE Place 1 application rectally 2 (two) times daily.   Yes Historical Provider, MD  ranitidine (ZANTAC) 300 MG tablet Take 300 mg by mouth at bedtime.   Yes Historical Provider, MD  sertraline (ZOLOFT) 25 MG tablet Take 25 mg by mouth at bedtime.   Yes Historical Provider, MD  sodium chloride (MURO 128) 5 % ophthalmic solution Place 1 drop into the left eye 3 (three) times daily.   Yes Historical Provider, MD  traZODone (DESYREL) 100 MG tablet Take 100 mg by mouth at bedtime.   Yes Historical Provider, MD   BP 142/101  Pulse 79  Temp(Src) 97.1 F (36.2 C) (Oral)  Resp 22  SpO2 95% Physical Exam  Constitutional: She is oriented to person, place, and time. She appears well-developed and well-nourished. No distress.  HENT:  Head: Normocephalic and atraumatic.  Eyes: EOM are normal. Pupils are equal, round, and reactive to light.  Neck: Normal range of motion. Neck supple.  Cardiovascular: Normal rate and regular rhythm.  Exam reveals no gallop and no friction rub.   No murmur heard. Pulmonary/Chest: Effort normal. She has no wheezes. She has no rales.  Abdominal: Soft. She exhibits no distension. There is no tenderness.  Musculoskeletal: She exhibits no edema and no tenderness.  Neurological: She is alert and oriented to person, place, and time.  Skin: Skin is warm and dry. She is not diaphoretic.  Psychiatric: She has a normal mood and affect. Her behavior is normal.    ED Course  Procedures (including critical care time) Labs Review Labs Reviewed  COMPREHENSIVE  METABOLIC PANEL - Abnormal; Notable for the following:    GFR calc non Af Amer 54 (*)    GFR calc Af Amer 62 (*)    Anion gap 21 (*)    All other components within normal limits  URINALYSIS, ROUTINE W REFLEX MICROSCOPIC - Abnormal; Notable for the following:    Bilirubin Urine MODERATE (*)    Ketones, ur >80 (*)    All other components within normal limits  CBC  AMMONIA  CBG MONITORING, ED  I-STAT CG4 LACTIC ACID, ED    Imaging Review Ct Head Wo Contrast  12/11/2013   CLINICAL DATA:  Altered mental status.  Paranoia.  Disorientation.  EXAM: CT HEAD WITHOUT CONTRAST  TECHNIQUE: Contiguous axial images were obtained from the base of the skull through the vertex without intravenous contrast.  COMPARISON:  10/16/2012  FINDINGS: No mass lesion. No midline shift. No acute hemorrhage or hematoma. No extra-axial fluid collections. No evidence of acute infarction. There is severe cerebellar atrophy and moderate  cerebral cortical atrophy without ventricular dilatation. There are old bilateral lacunar infarcts in the basal ganglia. No acute abnormalities. Osseous structures are normal. Retention cysts are present in the bases of both maxillary sinuses.  IMPRESSION: No acute intracranial abnormality. Diffuse atrophy. Old lacunar infarcts.   Electronically Signed   By: Geanie Cooley M.D.   On: 12/11/2013 19:17   Dg Chest Portable 1 View  12/11/2013   CLINICAL DATA:  Altered mental status.  EXAM: PORTABLE CHEST - 1 VIEW  COMPARISON:  12/17/2010.  FINDINGS: Mediastinum and hilar structures normal. Lungs are clear. Elevation of the left hemidiaphragm is noted. This is stable. No pleural effusion or pneumothorax. No acute bony abnormality. Degenerative changes both shoulders.  IMPRESSION: 1. Elevation left hemidiaphragm.  This is stable. 2. No acute cardiopulmonary disease.   Electronically Signed   By: Maisie Fus  Register   On: 12/11/2013 17:08     EKG Interpretation   Date/Time:  Thursday December 11 2013  15:17:28 EDT Ventricular Rate:  69 PR Interval:  148 QRS Duration: 122 QT Interval:  468 QTC Calculation: 501 R Axis:   96 Text Interpretation:  Normal sinus rhythm with sinus arrhythmia Right  bundle branch block Abnormal ECG No significant change since last tracing  Confirmed by YAO  MD, DAVID ([id number]) on 12/11/2013 6:08:44 PM      MDM   Final diagnoses:  Dehydration  Delirium    80 year-old female with altered bowel status we'll check CBC CMP ammonia EKG chest x-ray urine reevaluate.  Large anion gap, possibly due to ketosis from starvation, will check istat lactate.   Lactate normal, 80+ ketones in the urine. CT head negative, CXR negative, ua without infection.  Patient clinically dehydrated given a liter in the ED. She also with delirium. We'll admit to the hospital service.  1. Dehydration   2. Delirium      Melene Plan, MD 12/11/13 2114

## 2013-12-11 NOTE — ED Notes (Signed)
Reports possible uti.  Pt has not eaten in four days.  Having hallucinations.    Denies fever and any other symptoms.   Symptom on set 7/14

## 2013-12-11 NOTE — ED Provider Notes (Signed)
Catherine Howell is a 80 y.o. female who presents to Urgent Care today for disorientation. Patient was in her normal state of health living in assisted living facility until approximately 4 days ago. Since then she has stopped eating and drinking and taking any of her medications. She's developed delusions that people are planting bombs in her room. Her family notes that she's become more disoriented than usual. Family also notes development of urine and fecal incontinence.The patient feels "Terrible" but is unable to provide any specific complaints. She perseverates about people planting bombs. The assisted-living facility recommend family take the patient to the emergency room for evaluation. They are suspicious of a urinary tract infection.   Past Medical History  Diagnosis Date  . Blind   . Condyloma acuminatum   . 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 Hypertension   . Glaucoma   . Memory deficits 10/03/2012  . 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 Hearing deficit   . Vitamin D deficiency   . Gastroesophageal reflux disease   . COPD (chronic obstructive pulmonary disease)   . Depression    History  Substance Use Topics  . Smoking status: Former Games developermoker  . Smokeless tobacco: Not on file  . Alcohol Use: No   ROS as above Medications: No current facility-administered medications for this encounter.   Current Outpatient Prescriptions  Medication Sig Dispense Refill  . aspirin 81 MG tablet Take 81 mg by mouth daily.      . haloperidol (HALDOL) 0.5 MG tablet Take 0.5 mg by mouth at bedtime.      Marland Kitchen. lisinopril (PRINIVIL,ZESTRIL) 10 MG tablet Take 10 mg by mouth daily.      Marland Kitchen. loteprednol (LOTEMAX) 0.5 % ophthalmic suspension Place 1 drop into the left eye daily.      Marland Kitchen. ofloxacin (OCUFLOX) 0.3 % ophthalmic solution Place 1 drop into both eyes 3 (three) times daily.      . ranitidine (ZANTAC) 300 MG tablet Take 300 mg by mouth at bedtime.      . sertraline  (ZOLOFT) 50 MG tablet Take 50 mg by mouth at bedtime.      . Vitamin D, Ergocalciferol, (DRISDOL) 50000 UNITS CAPS Take 50,000 Units by mouth every 14 (fourteen) days.      . Acetaminophen 650 MG TABS Take 2 tablets by mouth every 12 (twelve) hours as needed (for pain).      Marland Kitchen. albuterol (PROAIR HFA) 108 (90 BASE) MCG/ACT inhaler Inhale 2 puffs into the lungs every 4 (four) hours as needed for wheezing.      . beclomethasone (QVAR) 80 MCG/ACT inhaler Inhale 1-3 puffs into the lungs every 12 (twelve) hours.      . docusate calcium (SURFAK) 240 MG capsule Take 240 mg by mouth daily.      Marland Kitchen. loratadine (CLARITIN) 10 MG tablet Take 10 mg by mouth daily.      . meclizine (ANTIVERT) 25 MG tablet Take 25 mg by mouth every 6 (six) hours as needed.      . Menthol-Methyl Salicylate (GRX ANALGESIC BALM) OINT Apply topically every 12 (twelve) hours as needed.      . sodium chloride (MURO 128) 5 % ophthalmic solution Place 1 drop into the left eye 3 (three) times daily.        Exam:  BP 96/63  Pulse 72  Resp 16  SpO2 98% Gen: Well NAD elderly appearing woman in poor health HEENT:   MMM  Lungs: Normal work of breathing. CTABL Heart: RRR no MRG Abd: NABS, Soft. Nondistended, Nontender   Results for orders placed during the hospital encounter of 12/11/13 (from the past 24 hour(s))  POCT URINALYSIS DIP (DEVICE)     Status: Abnormal   Collection Time    12/11/13  2:26 PM      Result Value Ref Range   Glucose, UA NEGATIVE  NEGATIVE mg/dL   Bilirubin Urine MODERATE (*) NEGATIVE   Ketones, ur 80 (*) NEGATIVE mg/dL   Specific Gravity, Urine >=1.030  1.005 - 1.030   Hgb urine dipstick TRACE (*) NEGATIVE   pH 5.5  5.0 - 8.0   Protein, ur 30 (*) NEGATIVE mg/dL   Urobilinogen, UA 1.0  0.0 - 1.0 mg/dL   Nitrite NEGATIVE  NEGATIVE   Leukocytes, UA NEGATIVE  NEGATIVE   No results found.  Assessment and Plan: 80 y.o. female with delusions. Patient appears to be ill. She has stopped eating and drinking and  developed delusions. She no longer is taking any of her medications. Recommend patient be transferred to the emergency room for evaluation and management of this issue.  Discussed warning signs or symptoms. Please see discharge instructions. Patient expresses understanding.   This note was created using Conservation officer, historic buildings. Any transcription errors are unintended.    Rodolph Bong, MD 12/11/13 [phone]

## 2013-12-12 DIAGNOSIS — J449 Chronic obstructive pulmonary disease, unspecified: Secondary | ICD-10-CM | POA: Diagnosis present

## 2013-12-12 DIAGNOSIS — H547 Unspecified visual loss: Secondary | ICD-10-CM | POA: Diagnosis present

## 2013-12-12 DIAGNOSIS — G9341 Metabolic encephalopathy: Secondary | ICD-10-CM | POA: Diagnosis present

## 2013-12-12 DIAGNOSIS — E860 Dehydration: Secondary | ICD-10-CM | POA: Diagnosis not present

## 2013-12-12 DIAGNOSIS — H409 Unspecified glaucoma: Secondary | ICD-10-CM | POA: Diagnosis present

## 2013-12-12 DIAGNOSIS — I10 Essential (primary) hypertension: Secondary | ICD-10-CM | POA: Diagnosis present

## 2013-12-12 LAB — MRSA PCR SCREENING: MRSA by PCR: NEGATIVE

## 2013-12-12 MED ORDER — ASPIRIN 81 MG PO CHEW
81.0000 mg | CHEWABLE_TABLET | Freq: Every day | ORAL | Status: DC
  Administered 2013-12-12: 81 mg via ORAL
  Filled 2013-12-12 (×2): qty 1

## 2013-12-12 MED ORDER — SODIUM CHLORIDE 0.9 % IJ SOLN
3.0000 mL | Freq: Two times a day (BID) | INTRAMUSCULAR | Status: DC
  Administered 2013-12-12 – 2013-12-13 (×3): 3 mL via INTRAVENOUS

## 2013-12-12 MED ORDER — HYDROCODONE-ACETAMINOPHEN 5-325 MG PO TABS: 1.0000 | ORAL_TABLET | ORAL | Status: DC | PRN

## 2013-12-12 MED ORDER — CETYLPYRIDINIUM CHLORIDE 0.05 % MT LIQD
7.0000 mL | Freq: Two times a day (BID) | OROMUCOSAL | Status: DC
  Administered 2013-12-12 – 2013-12-13 (×3): 7 mL via OROMUCOSAL

## 2013-12-12 MED ORDER — ONDANSETRON HCL 4 MG/2ML IJ SOLN: 4.0000 mg | Freq: Four times a day (QID) | INTRAMUSCULAR | Status: DC | PRN

## 2013-12-12 MED ORDER — ONDANSETRON HCL 4 MG PO TABS: 4.0000 mg | ORAL_TABLET | Freq: Four times a day (QID) | ORAL | Status: DC | PRN

## 2013-12-12 MED ORDER — ALBUTEROL SULFATE (2.5 MG/3ML) 0.083% IN NEBU: 3.0000 mL | INHALATION_SOLUTION | RESPIRATORY_TRACT | Status: DC | PRN

## 2013-12-12 MED ORDER — ERYTHROMYCIN 5 MG/GM OP OINT
TOPICAL_OINTMENT | Freq: Three times a day (TID) | OPHTHALMIC | Status: DC
  Administered 2013-12-12: 1 via OPHTHALMIC
  Administered 2013-12-12: 07:00:00 via OPHTHALMIC
  Administered 2013-12-13: 1 via OPHTHALMIC
  Filled 2013-12-12 (×3): qty 3.5

## 2013-12-12 MED ORDER — AMLODIPINE BESYLATE 10 MG PO TABS
10.0000 mg | ORAL_TABLET | Freq: Every day | ORAL | Status: DC
  Administered 2013-12-13: 10 mg via ORAL
  Filled 2013-12-12 (×2): qty 1

## 2013-12-12 MED ORDER — ACETAMINOPHEN 325 MG PO TABS
650.0000 mg | ORAL_TABLET | Freq: Four times a day (QID) | ORAL | Status: DC | PRN
Start: 2013-12-12 — End: 2013-12-13

## 2013-12-12 MED ORDER — SODIUM CHLORIDE 0.9 % IV SOLN
INTRAVENOUS | Status: DC
  Administered 2013-12-12: 1000 mL via INTRAVENOUS
  Administered 2013-12-13: 11:00:00 via INTRAVENOUS

## 2013-12-12 MED ORDER — ACETAMINOPHEN 650 MG RE SUPP: 650.0000 mg | Freq: Four times a day (QID) | RECTAL | Status: DC | PRN

## 2013-12-12 MED ORDER — ARTIFICIAL TEARS OP OINT
TOPICAL_OINTMENT | Freq: Three times a day (TID) | OPHTHALMIC | Status: DC
  Administered 2013-12-13: 05:00:00 via OPHTHALMIC
  Filled 2013-12-12: qty 3.5

## 2013-12-12 NOTE — Progress Notes (Signed)
Pt refused morning labs. Pt told the importance of labs and why the doctor ordered them. She still refused for them to be drawn.

## 2013-12-12 NOTE — Evaluation (Signed)
Clinical/Bedside Swallow Evaluation Patient Details  Name: Catherine Howell MRN: [id number] Date of Birth: 05/24/1930  Today's Date: 12/12/2013 Time: [id number] SLP Time Calculation (min): 8 min  Past Medical History:  Past Medical History  Diagnosis Date  . Blind   . Condyloma acuminatum   . 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 Hypertension   . Glaucoma   . Memory deficits 10/03/2012  . 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 Hearing deficit   . Vitamin D deficiency   . Gastroesophageal reflux disease   . COPD (chronic obstructive pulmonary disease)   . Depression    Past Surgical History:  Past Surgical History  Procedure Laterality Date  . Cholecystectomy    . Eye surgery     HPI:  Catherine Howell is a 80 y.o. female admitted for for dehydration and confusion. Pt with blindness and dementia.    Assessment / Plan / Recommendation Clinical Impression  Pt poorly participatory. Pt did accept minimal trials of water without evidence of aspiration. Otherwise pt refused all other attempts at PO. Given presentation, would suspect pt able to consume regular POs when willing. She reports she like meats and pork. Given no evidence of pna or signficant risk of aspiration, SLP will sign off. No f/u needed.     Aspiration Risk  Mild    Diet Recommendation Regular;Thin liquid   Liquid Administration via: Cup;Straw Medication Administration: Whole meds with liquid Postural Changes and/or Swallow Maneuvers: Seated upright 90 degrees    Other  Recommendations     Follow Up Recommendations       Frequency and Duration        Pertinent Vitals/Pain NA    SLP Swallow Goals     Swallow Study Prior Functional Status       General HPI: Catherine Howell is a 80 y.o. female admitted for for dehydration and confusion. Pt with blindness and dementia.  Type of Study: Bedside swallow evaluation Previous Swallow Assessment: none Diet Prior to this  Study: Regular;Thin liquids Temperature Spikes Noted: No Respiratory Status: Room air Behavior/Cognition: Confused;Uncooperative;Doesn't follow directions;Hard of hearing Oral Cavity - Dentition: Adequate natural dentition Self-Feeding Abilities: Total assist Patient Positioning: Upright in bed Baseline Vocal Quality: Clear Volitional Cough: Cognitively unable to elicit Volitional Swallow: Unable to elicit    Oral/Motor/Sensory Function Overall Oral Motor/Sensory Function:  (does not follow commands)   Ice Chips     Thin Liquid Thin Liquid: Within functional limits Presentation: Straw    Nectar Thick Nectar Thick Liquid: Not tested   Honey Thick Honey Thick Liquid: Not tested   Puree Puree: Not tested   Solid   GO    Solid: Not tested      Harlon DittyBonnie Princella Jaskiewicz, MA CCC-SLP [phone]  Claudine MoutonDeBlois, Kishan [place]ach[place] Caroline 12/12/2013,2:43 PM

## 2013-12-12 NOTE — Progress Notes (Signed)
The patients guardian, Eartha InchRondivoo Freeland will not be available till after 1630 in case the patient is discharged. She would like to talk to a Child psychotherapistsocial worker about possibly changing her level of care when she is discharged. The patient is currently at [facility]. Feel free to leave a voice message with the guardian.

## 2013-12-12 NOTE — Progress Notes (Signed)
PT Cancellation Note  Patient Details Name: Catherine Howell MRN: [id number] DOB: 03/12/1931   Cancelled Treatment:    Reason Eval/Treat Not Completed: Patient declined, no reason specified "Why don't you just get up on out of here!" "My doctor says No, [name], No." Pt refused to participate in therapy today even after explaining her MD is the one who consulted therapy. Pt irritated- heard verbalizing to herself in the room. Will follow up on 8/1 as appropriate.  [name], [name] 12/12/2013, 10:13 AM [name], PT, DPT [phone]

## 2013-12-12 NOTE — Progress Notes (Signed)
Pt admitted to the unit. Pt is alert and orientedx 1. Pt oriented to room, staff, and call bell. Bed in lowest position. Full assessment to Epic. Call bell with in reach. Told to call for assists. Will continue to monitor.  Norely Schlick E

## 2013-12-12 NOTE — ED Provider Notes (Signed)
I saw and evaluated the patient, reviewed the resident's note and I agree with the findings and plan.   EKG Interpretation   Date/Time:  Thursday December 11 2013 15:17:28 EDT Ventricular Rate:  69 PR Interval:  148 QRS Duration: 122 QT Interval:  468 QTC Calculation: 501 R Axis:   96 Text Interpretation:  Normal sinus rhythm with sinus arrhythmia Right  bundle branch block Abnormal ECG No significant change since last tracing  Confirmed by Jia Dottavio  MD, Timmy Bubeck ([id number]) on 12/11/2013 6:08:44 PM      Catherine Howell is a 80 y.o. female hx of HTN, blind, here with AMS. Stopped eating several days ago at assisted living. Denies falling. Also has been having visual hallucinations. Sent from urgent care. On exam, chronically ill. Vitals stable. Neuro exam nonfocal. Heart, lung, abdomen unremarkable. UA + ketones. AG elevated. Lactate nl. Given IVF. Will admit for dehydration, delirium.    Richardean Canalavid H Rishika Mccollom, MD 12/12/13 [phone]

## 2013-12-12 NOTE — Progress Notes (Signed)
Utilization review completed. Zaydin Billey, RN, BSN. 

## 2013-12-12 NOTE — Progress Notes (Signed)
OT Cancellation Note  Patient Details Name: Catherine Crumblernestine E Treu MRN: [id number] DOB: 01/27/1931   Cancelled Treatment:    Reason Eval/Treat Not Completed: Other (comment). Pt sleeping soundly, unable to maintain alertness--refused PT and bath earlier.  Will check back when schedule permits  Rhythm Gubbels 12/12/2013, 12:16 PM Marica OtterMaryellen Yina Riviere, OTR/L [phone] 12/12/2013

## 2013-12-12 NOTE — Care Management Note (Addendum)
    Page 1 of 1   12/12/2013     4:45:58 PM CARE MANAGEMENT NOTE 12/12/2013  Patient:  Catherine Howell,Catherine Howell   Account Number:  [id number]  Date Initiated:  12/12/2013  Documentation initiated by:  Catherine Howell  Subjective/Objective Assessment:   dx ams, not eating,  [facility] as [facility]     Action/Plan:   pt eval-pt refused to work with pt.   Anticipated DC Date:  12/13/2013   Anticipated DC Plan:  ASSISTED LIVING / REST HOME  In-house referral  Clinical Social Worker      DC Planning Services  CM consult      Choice offered to / List presented to:             Status of service:  In process, will continue to follow Medicare Important Message given?   (If response is "NO", the following Medicare IM given date fields will be blank) Date Medicare IM given:   Medicare IM given by:   Date Additional Medicare IM given:   Additional Medicare IM given by:    Discharge Disposition:    Per UR Regulation:  Reviewed for med. necessity/level of care/duration of stay  If discussed at Long Length of Stay Meetings, dates discussed:    Comments:  12/12/13 1518 Catherine Capeeborah Tyreese Thain RN, BSN [phone] [facility]  NCM will follow for dc needs.  NCM called neice , Ms Catherine Howell , left message for her that medicare outpt information sheet is left in patient's room for her to pick up.  Patient is confused. Neice is not here at hospital and did not return phone call.  NCM recieved call from neice Catherine Howell, informed her about the medicare outpt information sheet.

## 2013-12-12 NOTE — Progress Notes (Signed)
Patient Demographics  Catherine Howell, is a 80 y.o. female, DOB - Apr 04, 1931, ONG:[id number]  Admit date - 12/11/2013   Admitting Physician Therisa Doyne, MD  Outpatient Primary MD for the patient is Kaleen Mask, MD  LOS - 1   Chief Complaint  Patient presents with  . Altered Mental Status  . Paranoid        Subjective:   Catherine Howell today has, No headache, No chest pain, No abdominal pain - No Nausea, No new weakness tingling or numbness, No Cough - SOB.    Assessment & Plan    1. Metabolic encephalopathy in a patient with dementia and left eye blindness. Dementia likely secondary to dehydration and mild metabolic imbalance, improving with IV fluids which will be continued. Evaluated by PT, most likely will require placement. Will remain at risk for delirium we'll try to afford benzodiazepines.    2. Glaucoma, blind in left eye, left eye conjunctival. Case discussed with Dr. Dione Booze ophthalmologist on call in detail on 12/12/2013, recommendation to continue present eyedrops and had Lacri-Lube, outpatient followup in his office post discharge.    3.HTN - add Norvasc to ACE inhibitor and monitor.    4. GERD. Continue famotidine.    5. History of depression. On trazodone and Sertraline, continued.      Code Status: DO NOT RESUSCITATE  Family Communication: None present  Disposition Plan: SNF   Procedures CT head   Consults ophthalmologist Dr. Dione Booze over the phone   Medications  Scheduled Meds: . amLODipine  10 mg Oral Daily  . antiseptic oral rinse  7 mL Mouth Rinse BID  . artificial tears   Left Eye 3 times per day  . aspirin  81 mg Oral Daily  . docusate sodium  100 mg Oral Daily  . enoxaparin (LOVENOX) injection  40 mg Subcutaneous QHS  .  erythromycin   Left Eye 3 times per day  . famotidine  10 mg Oral Daily  . fluticasone  1 puff Inhalation BID  . haloperidol  0.5 mg Oral BID  . lisinopril  5 mg Oral Daily  . loratadine  10 mg Oral Daily  . loteprednol  1 drop Left Eye Daily  . sertraline  25 mg Oral QHS  . sodium chloride  1 drop Left Eye TID  . sodium chloride  3 mL Intravenous Q12H  . thiamine  100 mg Intravenous Daily  . traZODone  100 mg Oral QHS   Continuous Infusions:  PRN Meds:.acetaminophen, acetaminophen, albuterol, HYDROcodone-acetaminophen, ondansetron (ZOFRAN) IV, ondansetron  DVT Prophylaxis  Lovenox   Lab Results  Component Value Date   PLT 156 12/11/2013    Antibiotics     Anti-infectives   None       Objective:   Filed Vitals:   12/12/13 0031 12/12/13 0623 12/12/13 0755 12/12/13 0938  BP: 149/82 126/78  151/88  Pulse: 77 58  70  Temp: 98.5 F (36.9 C) 98.2 F (36.8 C)  98.5 F (36.9 C)  TempSrc: Axillary Axillary  Axillary  Resp:  16  20  Weight: 32.9 kg (72 lb 8.5 oz)     SpO2: 100% 98% 98% 98%    Wt Readings from Last 3 Encounters:  12/12/13 32.9 kg (72 lb 8.5  oz)  10/03/12 58.968 kg (130 lb)  08/14/12 58.06 kg (128 lb)     Intake/Output Summary (Last 24 hours) at 12/12/13 0956 Last data filed at 12/12/13 0900  Gross per 24 hour  Intake   1500 ml  Output      0 ml  Net   1500 ml     Physical Exam  Awake  Oriented X 2, No new F.N deficits, Normal affect .AT,L I. has some secretions, there is a conjunctival cystlike structure noted at 10:00 position, no signs of frank conjunctivitis or orbital infection Supple Neck,No JVD, No cervical lymphadenopathy appriciated.  Symmetrical Chest wall movement, Good air movement bilaterally, CTAB RRR,No Gallops,Rubs or new Murmurs, No Parasternal Heave +ve B.Sounds, Abd Soft, No tenderness, No organomegaly appriciated, No rebound - guarding or rigidity. No Cyanosis, Clubbing or edema, No new Rash or bruise     Data Review     Micro Results Recent Results (from the past 240 hour(s))  MRSA PCR SCREENING     Status: None   Collection Time    12/12/13 12:58 AM      Result Value Ref Range Status   MRSA by PCR NEGATIVE  NEGATIVE Final   Comment:            The GeneXpert MRSA Assay (FDA     approved for NASAL specimens     only), is one component of a     comprehensive MRSA colonization     surveillance program. It is not     intended to diagnose MRSA     infection nor to guide or     monitor treatment for     MRSA infections.    Radiology Reports Ct Head Wo Contrast  12/11/2013   CLINICAL DATA:  Altered mental status.  Paranoia.  Disorientation.  EXAM: CT HEAD WITHOUT CONTRAST  TECHNIQUE: Contiguous axial images were obtained from the base of the skull through the vertex without intravenous contrast.  COMPARISON:  10/16/2012  FINDINGS: No mass lesion. No midline shift. No acute hemorrhage or hematoma. No extra-axial fluid collections. No evidence of acute infarction. There is severe cerebellar atrophy and moderate cerebral cortical atrophy without ventricular dilatation. There are old bilateral lacunar infarcts in the basal ganglia. No acute abnormalities. Osseous structures are normal. Retention cysts are present in the bases of both maxillary sinuses.  IMPRESSION: No acute intracranial abnormality. Diffuse atrophy. Old lacunar infarcts.   Electronically Signed   By: Geanie CooleyJim  Maxwell M.D.   On: 12/11/2013 19:17   Dg Chest Portable 1 View  12/11/2013   CLINICAL DATA:  Altered mental status.  EXAM: PORTABLE CHEST - 1 VIEW  COMPARISON:  12/17/2010.  FINDINGS: Mediastinum and hilar structures normal. Lungs are clear. Elevation of the left hemidiaphragm is noted. This is stable. No pleural effusion or pneumothorax. No acute bony abnormality. Degenerative changes both shoulders.  IMPRESSION: 1. Elevation left hemidiaphragm.  This is stable. 2. No acute cardiopulmonary disease.   Electronically Signed   By: Maisie Fushomas  Register    On: 12/11/2013 17:08    CBC  Recent Labs Lab 12/11/13 1522  WBC 7.1  HGB 12.6  HCT 39.2  PLT 156  MCV 94.2  MCH 30.3  MCHC 32.1  RDW 12.7    Chemistries   Recent Labs Lab 12/11/13 1522  NA 141  K 3.8  CL 101  CO2 19  GLUCOSE 75  BUN 20  CREATININE 0.97  CALCIUM 9.2  AST 20  ALT 10  ALKPHOS  73  BILITOT 0.8   ------------------------------------------------------------------------------------------------------------------ CrCl is unknown because both a height and weight (above a minimum accepted value) are required for this calculation. ------------------------------------------------------------------------------------------------------------------ No results found for this basename: HGBA1C,  in the last 72 hours ------------------------------------------------------------------------------------------------------------------ No results found for this basename: CHOL, HDL, LDLCALC, TRIG, CHOLHDL, LDLDIRECT,  in the last 72 hours ------------------------------------------------------------------------------------------------------------------ No results found for this basename: TSH, T4TOTAL, FREET3, T3FREE, THYROIDAB,  in the last 72 hours ------------------------------------------------------------------------------------------------------------------ No results found for this basename: VITAMINB12, FOLATE, FERRITIN, TIBC, IRON, RETICCTPCT,  in the last 72 hours  Coagulation profile No results found for this basename: INR, PROTIME,  in the last 168 hours  No results found for this basename: DDIMER,  in the last 72 hours  Cardiac Enzymes No results found for this basename: CK, CKMB, TROPONINI, MYOGLOBIN,  in the last 168 hours ------------------------------------------------------------------------------------------------------------------ No components found with this basename: POCBNP,      Time Spent in minutes  35   Aleyna Cueva K M.D on 12/12/2013 at  9:56 AM  Between 7am to 7pm - Pager - [phone]  After 7pm go to [email or web address] - password [email or web address]  And look for the night coverage person covering for me after hours  [facility] Office  [phone]   **Disclaimer: This note may have been dictated with voice recognition software. Similar sounding words can inadvertently be transcribed and this note may contain transcription errors which may not have been corrected upon publication of note.**

## 2013-12-13 DIAGNOSIS — E860 Dehydration: Secondary | ICD-10-CM | POA: Diagnosis not present

## 2013-12-13 MED ORDER — AMLODIPINE BESYLATE 10 MG PO TABS: 10.0000 mg | ORAL_TABLET | Freq: Every day | ORAL | Status: DC

## 2013-12-13 MED ORDER — VITAMIN B-1 100 MG PO TABS
100.0000 mg | ORAL_TABLET | Freq: Every day | ORAL | Status: DC
  Filled 2013-12-13: qty 1

## 2013-12-13 MED ORDER — ARTIFICIAL TEARS OP OINT: TOPICAL_OINTMENT | Freq: Three times a day (TID) | OPHTHALMIC | Status: DC

## 2013-12-13 NOTE — Progress Notes (Signed)
PT Cancellation Note  Patient Details Name: Catherine Howell MRN: [id number] DOB: 01/02/1931   Cancelled Treatment:    Reason Eval/Treat Not Completed: Patient declined, no reason specified  Pt adamantly declining PT eval.  Pt stating "I wish you would just shut up."  PT to attempt for the third time tomorrow.     Ilda FoilGarrow, Aldred Mase Rene 12/13/2013, 9:07 AM  Aida RaiderWendy Axil Copeman, PT  Office # [phone] Pager [phone]

## 2013-12-13 NOTE — Discharge Summary (Signed)
Catherine Howell, is a 80 y.o. female  DOB 1930/11/23  MRN [id number].  Admission date:  12/11/2013  Admitting Physician  Therisa Doyne, MD  Discharge Date:  12/13/2013   Primary MD  Kaleen Mask, MD  Recommendations for primary care physician for things to follow:   Needs outpatient followup with ophthalmology within the next week   Admission Diagnosis  Dehydration [276.51] Delirium [780.09] Encephalopathy, metabolic [348.31] Dementia with behavioral disturbance [294.21]   Discharge Diagnosis  Dehydration [276.51] Delirium [780.09] Encephalopathy, metabolic [348.31] Dementia with behavioral disturbance [294.21]     Principal Problem:   Encephalopathy, metabolic Active Problems:   Memory deficits   Dehydration   Glaucoma   Blindness   COPD (chronic obstructive pulmonary disease)   Hypertension   Metabolic encephalopathy      Past Medical History  Diagnosis Date  . Blind   . Condyloma acuminatum   . 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 Hypertension   . Glaucoma   . Memory deficits 10/03/2012  . 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 Hearing deficit   . Vitamin D deficiency   . Gastroesophageal reflux disease   . COPD (chronic obstructive pulmonary disease)   . Depression     Past Surgical History  Procedure Laterality Date  . Cholecystectomy    . Eye surgery         History of present illness and  Hospital Course:     Kindly see H&P for history of present illness and admission details, please review complete Labs, Consult reports and Test reports for all details in brief  HPI  from the history and physical done on the day of admission   Catherine Howell is a 80 y.o. female has a past medical history of Blind; Condyloma acuminatum; Other specified  noninflammatory disorder of vulva and perineum; Hypertension; Glaucoma; Memory deficits (10/03/2012); Blindness; Hearing deficit; Vitamin D deficiency; Gastroesophageal reflux disease; COPD (chronic obstructive pulmonary disease); and Depression.   Patient started to become progressively more disoriented for the past 2 weeks and then 6 days ago stopped eating and recently stopped drinking. She resides as Assisted living at [facility]. She is confused at baseline but able to do some things although needs assistance with ADL's. She has not been complaining of any pain. Patient has no family her guardian is at bedside. Her guardian states that the patient is able to ambulate. Her by mouth intake has somewhat improved that she has eaten a bowl sleep today. She is still very confused. She was taken to urgent care today for father workup. UA did not show any evidence of urinary tract, CT scan unremarkable.  No new findings and chest x-ray also stable from prior. Procardia and she had one loose bowel movement today but otherwise no diarrhea. No fever, no other complaints.     Hospital Course    1. Metabolic encephalopathy in a patient with dementia and left eye blindness. Likely mild delirium on top of  chronic Dementia likely secondary to dehydration and mild metabolic imbalance, improvied after gentle IV fluids, and I also think patient has gradually progressive dementia and might get episodes of delirium regardless of her underlying medical situation during evening and early morning hours. Reduce benzodiazepine use continue Haldol as before on an as-needed basis, fall aspiration precautions. Per social work patient will be discharged to ALF and subsequently to SNF as needed. She is close to baseline now.    2. Glaucoma, blind in left eye, left eye conjunctival. Case discussed with Dr. Dione BoozeGroat ophthalmologist on call in detail on 12/12/2013, recommendation to continue present eyedrops and had  Lacri-Lube, outpatient followup in his office post discharge.    3.HTN - add Norvasc to ACE inhibitor and monitor.    4. GERD. Continue famotidine.    5. History of depression. On trazodone and Sertraline, continued.       Discharge Condition: stable   Follow UP  Follow-up Information   Follow up with Madilyn HookGroat, Christopher L, MD In 3 days. (L eye exam)    Specialty:  Optometry   Contact information:   [address] [place] [id number] [phone]       Follow up with Kaleen MaskELKINS,[name], MD. Schedule an appointment as soon as possible for a visit in 3 days.   Specialty:  Family Medicine   Contact information:   [address] [place] [address] [phone]         Discharge Instructions  and  Discharge Medications      Discharge Instructions   Discharge instructions    Complete by:  As directed   Follow with Primary MD Kaleen MaskELKINS,[name], MD in 7 days   Get CBC, CMP  checked  by Primary MD next visit.    Activity: As tolerated with Full fall precautions use walker/cane & assistance as needed   Disposition ALF   Diet: Heart Healthy with a full feeding assistance and aspiration precautions.   For Heart failure patients - Check your Weight same time everyday, if you gain over 2 pounds, or you develop in leg swelling, experience more shortness of breath or chest pain, call your Primary MD immediately. Follow Cardiac Low Salt Diet and 1.8 lit/day fluid restriction.   On your next visit with her primary care physician please Get Medicines reviewed and adjusted.  Please request your Prim.MD to go over all Hospital Tests and Procedure/Radiological results at the follow up, please get all Hospital records sent to your Prim MD by signing hospital release before you go home.   If you experience worsening of your admission symptoms, develop shortness of breath, life threatening emergency, suicidal or homicidal thoughts you must seek  medical attention immediately by calling 911 or calling your MD immediately  if symptoms less severe.  You Must read complete instructions/literature along with all the possible adverse reactions/side effects for all the Medicines you take and that have been prescribed to you. Take any new Medicines after you have completely understood and accpet all the possible adverse reactions/side effects.   Do not drive, operating heavy machinery, perform activities at heights, swimming or participation in water activities or provide baby sitting services if your were admitted for syncope or siezures until you have seen by Primary MD or a Neurologist and advised to do so again.  Do not drive when taking Pain medications.    Do not take more than prescribed Pain, Sleep and Anxiety Medications  Special Instructions: If you have smoked  or chewed Tobacco  in the last 2 yrs please stop smoking, stop any regular Alcohol  and or any Recreational drug use.  Wear Seat belts while driving.   Please note  You were cared for by a hospitalist during your hospital stay. If you have any questions about your discharge medications or the care you received while you were in the hospital after you are discharged, you can call the unit and asked to speak with the hospitalist on call if the hospitalist that took care of you is not available. Once you are discharged, your primary care physician will handle any further medical issues. Please note that NO REFILLS for any discharge medications will be authorized once you are discharged, as it is imperative that you return to your primary care physician (or establish a relationship with a primary care physician if you do not have one) for your aftercare needs so that they can reassess your need for medications and monitor your lab values.     Increase activity slowly    Complete by:  As directed             Medication List         acetaminophen 500 MG tablet  Commonly  known as:  TYLENOL  Take 500 mg by mouth 2 (two) times daily.     amLODipine 10 MG tablet  Commonly known as:  NORVASC  Take 1 tablet (10 mg total) by mouth daily.     artificial tears Oint ophthalmic ointment  Place into the left eye every 8 (eight) hours.     aspirin 81 MG tablet  Take 81 mg by mouth daily.     beclomethasone 80 MCG/ACT inhaler  Commonly known as:  QVAR  Inhale 1-3 puffs into the lungs every 12 (twelve) hours.     D3-1000 1000 UNITS tablet  Generic drug:  Cholecalciferol  Take 1,000 Units by mouth daily.     docusate calcium 240 MG capsule  Commonly known as:  SURFAK  Take 240 mg by mouth daily.     GRX ANALGESIC BALM Oint  Apply topically every 12 (twelve) hours as needed.     haloperidol 0.5 MG tablet  Commonly known as:  HALDOL  Take 0.5 mg by mouth 2 (two) times daily.     lisinopril 5 MG tablet  Commonly known as:  PRINIVIL,ZESTRIL  Take 5 mg by mouth daily.     loratadine 10 MG tablet  Commonly known as:  CLARITIN  Take 10 mg by mouth daily.     loteprednol 0.5 % ophthalmic suspension  Commonly known as:  LOTEMAX  Place 1 drop into the left eye daily.     meclizine 25 MG tablet  Commonly known as:  ANTIVERT  Take 25 mg by mouth every 6 (six) hours as needed.     PRAMOX-PE-GLYCERIN-PETROLATUM RE  Place 1 application rectally 2 (two) times daily.     PROAIR HFA 108 (90 BASE) MCG/ACT inhaler  Generic drug:  albuterol  Inhale 2 puffs into the lungs every 4 (four) hours as needed for wheezing.     ranitidine 300 MG tablet  Commonly known as:  ZANTAC  Take 300 mg by mouth at bedtime.     sertraline 25 MG tablet  Commonly known as:  ZOLOFT  Take 25 mg by mouth at bedtime.     sodium chloride 5 % ophthalmic solution  Commonly known as:  MURO 128  Place 1 drop into the left eye  3 (three) times daily.     traZODone 100 MG tablet  Commonly known as:  DESYREL  Take 100 mg by mouth at bedtime.          Diet and Activity  recommendation: See Discharge Instructions above   Consults obtained - ophthalmologist Dr. Dione Booze over the phone   Major procedures and Radiology Reports - PLEASE review detailed and final reports for all details, in brief -       Ct Head Wo Contrast  12/11/2013   CLINICAL DATA:  Altered mental status.  Paranoia.  Disorientation.  EXAM: CT HEAD WITHOUT CONTRAST  TECHNIQUE: Contiguous axial images were obtained from the base of the skull through the vertex without intravenous contrast.  COMPARISON:  10/16/2012  FINDINGS: No mass lesion. No midline shift. No acute hemorrhage or hematoma. No extra-axial fluid collections. No evidence of acute infarction. There is severe cerebellar atrophy and moderate cerebral cortical atrophy without ventricular dilatation. There are old bilateral lacunar infarcts in the basal ganglia. No acute abnormalities. Osseous structures are normal. Retention cysts are present in the bases of both maxillary sinuses.  IMPRESSION: No acute intracranial abnormality. Diffuse atrophy. Old lacunar infarcts.   Electronically Signed   By: Geanie Cooley M.D.   On: 12/11/2013 19:17   Dg Chest Portable 1 View  12/11/2013   CLINICAL DATA:  Altered mental status.  EXAM: PORTABLE CHEST - 1 VIEW  COMPARISON:  12/17/2010.  FINDINGS: Mediastinum and hilar structures normal. Lungs are clear. Elevation of the left hemidiaphragm is noted. This is stable. No pleural effusion or pneumothorax. No acute bony abnormality. Degenerative changes both shoulders.  IMPRESSION: 1. Elevation left hemidiaphragm.  This is stable. 2. No acute cardiopulmonary disease.   Electronically Signed   By: Maisie Fus  Register   On: 12/11/2013 17:08    Micro Results      Recent Results (from the past 240 hour(s))  MRSA PCR SCREENING     Status: None   Collection Time    12/12/13 12:58 AM      Result Value Ref Range Status   MRSA by PCR NEGATIVE  NEGATIVE Final   Comment:            The GeneXpert MRSA Assay (FDA      approved for NASAL specimens     only), is one component of a     comprehensive MRSA colonization     surveillance program. It is not     intended to diagnose MRSA     infection nor to guide or     monitor treatment for     MRSA infections.       Today   Subjective:   Francene Mcerlean today has no headache,no chest abdominal pain,no new weakness tingling or numbness, feels much better.  Objective:   Blood pressure 147/77, pulse 62, temperature 98.3 F (36.8 C), temperature source Oral, resp. rate 18, weight 32.9 kg (72 lb 8.5 oz), SpO2 99.00%.   Intake/Output Summary (Last 24 hours) at 12/13/13 1103 Last data filed at 12/13/13 1053  Gross per 24 hour  Intake 1019.67 ml  Output      0 ml  Net 1019.67 ml    Exam Awake Alert, Oriented x 2, No new F.N deficits, Normal affect Rami[place]o.AT,PERRAL L eye has some secretions, there is a conjunctival cystlike structure noted at 10:00 position, no signs of frank conjunctivitis or orbital infection Supple Neck,No JVD, No cervical lymphadenopathy appriciated.  Symmetrical Chest wall movement, Good air movement bilaterally,  CTAB RRR,No Gallops,Rubs or new Murmurs, No Parasternal Heave +ve B.Sounds, Abd Soft, Non tender, No organomegaly appriciated, No rebound -guarding or rigidity. No Cyanosis, Clubbing or edema, No new Rash or bruise  Data Review   CBC w Diff: Lab Results  Component Value Date   WBC 7.1 12/11/2013   HGB 12.6 12/11/2013   HCT 39.2 12/11/2013   PLT 156 12/11/2013   LYMPHOPCT 27 12/17/2010   MONOPCT 8 12/17/2010   EOSPCT 4 12/17/2010   BASOPCT 1 12/17/2010    CMP: Lab Results  Component Value Date   NA 141 12/11/2013   K 3.8 12/11/2013   CL 101 12/11/2013   CO2 19 12/11/2013   BUN 20 12/11/2013   CREATININE 0.97 12/11/2013   PROT 7.1 12/11/2013   ALBUMIN 3.9 12/11/2013   BILITOT 0.8 12/11/2013   ALKPHOS 73 12/11/2013   AST 20 12/11/2013   ALT 10 12/11/2013  .   Total Time in preparing paper work, data evaluation and todays  exam - 35 minutes  Leroy Sea M.D on 12/13/2013 at 11:03 AM  [facility] Office  [phone]   **Disclaimer: This note may have been dictated with voice recognition software. Similar sounding words can inadvertently be transcribed and this note may contain transcription errors which may not have been corrected upon publication of note.**

## 2013-12-13 NOTE — Discharge Instructions (Signed)
Follow with Primary MD Catherine Howell,[name], MD in 7 days   Get CBC, CMP  checked  by Primary MD next visit.    Activity: As tolerated with Full fall precautions use walker/cane & assistance as needed   Disposition ALF   Diet: Heart Healthy with a full feeding assistance and aspiration precautions.   For Heart failure patients - Check your Weight same time everyday, if you gain over 2 pounds, or you develop in leg swelling, experience more shortness of breath or chest pain, call your Primary MD immediately. Follow Cardiac Low Salt Diet and 1.8 lit/day fluid restriction.   On your next visit with her primary care physician please Get Medicines reviewed and adjusted.  Please request your Prim.MD to go over all Hospital Tests and Procedure/Radiological results at the follow up, please get all Hospital records sent to your Prim MD by signing hospital release before you go home.   If you experience worsening of your admission symptoms, develop shortness of breath, life threatening emergency, suicidal or homicidal thoughts you must seek medical attention immediately by calling 911 or calling your MD immediately  if symptoms less severe.  You Must read complete instructions/literature along with all the possible adverse reactions/side effects for all the Medicines you take and that have been prescribed to you. Take any new Medicines after you have completely understood and accpet all the possible adverse reactions/side effects.   Do not drive, operating heavy machinery, perform activities at heights, swimming or participation in water activities or provide baby sitting services if your were admitted for syncope or siezures until you have seen by Primary MD or a Neurologist and advised to do so again.  Do not drive when taking Pain medications.    Do not take more than prescribed Pain, Sleep and Anxiety Medications  Special Instructions: If you have smoked or chewed Tobacco  in the last 2  yrs please stop smoking, stop any regular Alcohol  and or any Recreational drug use.  Wear Seat belts while driving.   Please note  You were cared for by a hospitalist during your hospital stay. If you have any questions about your discharge medications or the care you received while you were in the hospital after you are discharged, you can call the unit and asked to speak with the hospitalist on call if the hospitalist that took care of you is not available. Once you are discharged, your primary care physician will handle any further medical issues. Please note that NO REFILLS for any discharge medications will be authorized once you are discharged, as it is imperative that you return to your primary care physician (or establish a relationship with a primary care physician if you do not have one) for your aftercare needs so that they can reassess your need for medications and monitor your lab values.

## 2013-12-13 NOTE — Clinical Social Work Note (Addendum)
10:20am- CSW spoke with Ms Catherine Howell, guardian for pt.  Pt is observation status and has Medicare- pt will not qualify at this time for STR/SNF.  [facility] who is agreeable for pt to return.  Guardian is agreeable to seeking LTC placement (if guardian feels more appropriate for pt) once pt is dc'd from hospital back to ALF.  [facility] is agreeable to assisting guardian in finding appropriate LTC placement upon return.  CSW updated RN and MD.  Alen BleacherGuardian is prepared to transport pt to ALF today once dc'd.    9:57am- CSW left message for Ms. Freeland.  CSW awaiting a return call.  Catherine Howell, LCSWA [phone]  Clinical Social Work

## 2013-12-13 NOTE — Progress Notes (Signed)
12/13/13 [facility]. IV site removed,Tele removed. Discharge instructions reviewed with Guardian.

## 2013-12-13 NOTE — Discharge Planning (Signed)
[facility] to [facility]- confirmed with facility RN to call report to [phone] Transportation to be provided by: guardian RN to arrange dc with guardian- packet on chart  **packet to be delivered to ALF by guardian.  Guardian aware and agreeable.Vickii Penna**  Gina Matasha Smigelski, LCSWA [phone]  Clinical Social Work

## 2013-12-13 NOTE — Clinical Social Work Psychosocial (Signed)
Clinical Social Work Department BRIEF PSYCHOSOCIAL ASSESSMENT 12/13/2013  Patient:  Catherine Howell,[name]     Account Number:  [id number]     Admit date:  12/11/2013  Clinical Social Worker:  Read DriversINGLE,Kaelan Emami, LCSWA  Date/Time:  12/13/2013 02:46 PM  Referred by:  Physician  Date Referred:  12/13/2013 Referred for  SNF Placement  ALF Placement  Psychosocial assessment   Other Referral:   none   Interview type:  Other - See comment Other interview type:   guardian, Rondivoo Howell    PSYCHOSOCIAL DATA Living Status:  FACILITY Admitted from facility:  [facility] Level of care:  Assisted Living Primary support name:  Rondivoo Primary support relationship to patient:  NONE Degree of support available:   adequate- Rondivoo is pt guardian    CURRENT CONCERNS Current Concerns  Post-Acute Placement   Other Concerns:   none    SOCIAL WORK ASSESSMENT / PLAN Pt is only alert and oriented to self. CSW contacted pt guardian, Catherine Howell by phone who reports that [facility].  Guardian states that she feels that pt is now appropriate for long term skilled care.  CSW informed guardian that pt is observation status and has been discharged from the hospital with ALF being deemed safe dc.  Guardian is agreeable to this plan.  [facility] and obtained permission for pt to return.  CSW also spoke with admission's coordinator about assisting the guardian with the transition to the next level of care for this pt.  Admission's coordinator is in agreement with this plan and will coordinate meeting with guardian.  Guardian aware of this process and agreeable to follow up [facility].   Assessment/plan status:  No Further Intervention Required Other assessment/ plan:   FL2 updated  PASARR existing   Information/referral to community resources:   ALF    PATIENT'S/FAMILY'S RESPONSE TO PLAN OF CARE: Guardian was very appreciative of CSW  intervention, advocating and assistance.  Guardian and admission's coordinator agreeable for pt to return with the intentions of LTC placement.       Vickii PennaGina Patti Shorb, LCSWA [phone]  Clinical Social Work

## 2013-12-21 ENCOUNTER — Encounter (HOSPITAL_COMMUNITY): Payer: Self-pay | Admitting: Emergency Medicine

## 2013-12-21 ENCOUNTER — Emergency Department (HOSPITAL_COMMUNITY)
Admission: EM | Admit: 2013-12-21 | Discharge: 2013-12-22 | Disposition: A | Discharge status: Home / Self Care | Payer: Medicare Other | Attending: Emergency Medicine | Admitting: Emergency Medicine

## 2013-12-21 ENCOUNTER — Emergency Department (HOSPITAL_COMMUNITY): Payer: Medicare Other

## 2013-12-21 DIAGNOSIS — IMO0002 Reserved for concepts with insufficient information to code with codable children: Secondary | ICD-10-CM | POA: Insufficient documentation

## 2013-12-21 DIAGNOSIS — Z87448 Personal history of other diseases of urinary system: Secondary | ICD-10-CM | POA: Insufficient documentation

## 2013-12-21 DIAGNOSIS — H543 Unqualified visual loss, both eyes: Secondary | ICD-10-CM | POA: Diagnosis not present

## 2013-12-21 DIAGNOSIS — K219 Gastro-esophageal reflux disease without esophagitis: Secondary | ICD-10-CM | POA: Insufficient documentation

## 2013-12-21 DIAGNOSIS — Z79899 Other long term (current) drug therapy: Secondary | ICD-10-CM | POA: Diagnosis not present

## 2013-12-21 DIAGNOSIS — R4182 Altered mental status, unspecified: Secondary | ICD-10-CM | POA: Insufficient documentation

## 2013-12-21 DIAGNOSIS — Z87891 Personal history of nicotine dependence: Secondary | ICD-10-CM | POA: Insufficient documentation

## 2013-12-21 DIAGNOSIS — F329 Major depressive disorder, single episode, unspecified: Secondary | ICD-10-CM | POA: Diagnosis not present

## 2013-12-21 DIAGNOSIS — Z8619 Personal history of other infectious and parasitic diseases: Secondary | ICD-10-CM | POA: Diagnosis not present

## 2013-12-21 DIAGNOSIS — J449 Chronic obstructive pulmonary disease, unspecified: Secondary | ICD-10-CM | POA: Diagnosis not present

## 2013-12-21 DIAGNOSIS — F22 Delusional disorders: Secondary | ICD-10-CM | POA: Diagnosis not present

## 2013-12-21 DIAGNOSIS — H109 Unspecified conjunctivitis: Secondary | ICD-10-CM | POA: Diagnosis not present

## 2013-12-21 DIAGNOSIS — E860 Dehydration: Secondary | ICD-10-CM

## 2013-12-21 DIAGNOSIS — I10 Essential (primary) hypertension: Secondary | ICD-10-CM | POA: Insufficient documentation

## 2013-12-21 DIAGNOSIS — F3289 Other specified depressive episodes: Secondary | ICD-10-CM | POA: Diagnosis not present

## 2013-12-21 DIAGNOSIS — E559 Vitamin D deficiency, unspecified: Secondary | ICD-10-CM | POA: Diagnosis not present

## 2013-12-21 DIAGNOSIS — J4489 Other specified chronic obstructive pulmonary disease: Secondary | ICD-10-CM | POA: Insufficient documentation

## 2013-12-21 DIAGNOSIS — Z7982 Long term (current) use of aspirin: Secondary | ICD-10-CM | POA: Diagnosis not present

## 2013-12-21 LAB — CBC WITH DIFFERENTIAL/PLATELET
Basophils Absolute: 0 10*3/uL (ref 0.0–0.1)
Basophils Relative: 1 % (ref 0–1)
Eosinophils Absolute: 0.1 10*3/uL (ref 0.0–0.7)
Eosinophils Relative: 1 % (ref 0–5)
HCT: 41.1 % (ref 36.0–46.0)
HEMOGLOBIN: 14.1 g/dL (ref 12.0–15.0)
LYMPHS ABS: 1.5 10*3/uL (ref 0.7–4.0)
Lymphocytes Relative: 19 % (ref 12–46)
MCH: 30.6 pg (ref 26.0–34.0)
MCHC: 34.3 g/dL (ref 30.0–36.0)
MCV: 89.2 fL (ref 78.0–100.0)
MONOS PCT: 10 % (ref 3–12)
Monocytes Absolute: 0.8 10*3/uL (ref 0.1–1.0)
NEUTROS ABS: 5.4 10*3/uL (ref 1.7–7.7)
NEUTROS PCT: 69 % (ref 43–77)
Platelets: 200 10*3/uL (ref 150–400)
RBC: 4.61 MIL/uL (ref 3.87–5.11)
RDW: 12.9 % (ref 11.5–15.5)
WBC: 7.8 10*3/uL (ref 4.0–10.5)

## 2013-12-21 LAB — COMPREHENSIVE METABOLIC PANEL
ALBUMIN: 3.8 g/dL (ref 3.5–5.2)
ALK PHOS: 86 U/L (ref 39–117)
ALT: 15 U/L (ref 0–35)
AST: 26 U/L (ref 0–37)
Anion gap: 22 — ABNORMAL HIGH (ref 5–15)
BUN: 26 mg/dL — ABNORMAL HIGH (ref 6–23)
CO2: 17 mEq/L — ABNORMAL LOW (ref 19–32)
Calcium: 10 mg/dL (ref 8.4–10.5)
Chloride: 108 mEq/L (ref 96–112)
Creatinine, Ser: 0.82 mg/dL (ref 0.50–1.10)
GFR calc non Af Amer: 66 mL/min — ABNORMAL LOW (ref >90)
GFR, EST AFRICAN AMERICAN: 76 mL/min — AB (ref >90)
GLUCOSE: 100 mg/dL — AB (ref 70–99)
POTASSIUM: 3.8 meq/L (ref 3.7–5.3)
SODIUM: 147 meq/L (ref 137–147)
Total Bilirubin: 0.7 mg/dL (ref 0.3–1.2)
Total Protein: 8 g/dL (ref 6.0–8.3)

## 2013-12-21 LAB — URINALYSIS, ROUTINE W REFLEX MICROSCOPIC
Glucose, UA: NEGATIVE mg/dL
KETONES UR: 40 mg/dL — AB
Leukocytes, UA: NEGATIVE
NITRITE: NEGATIVE
PH: 5.5 (ref 5.0–8.0)
Protein, ur: 30 mg/dL — AB
SPECIFIC GRAVITY, URINE: 1.024 (ref 1.005–1.030)
Urobilinogen, UA: 1 mg/dL (ref 0.0–1.0)

## 2013-12-21 LAB — I-STAT TROPONIN, ED: TROPONIN I, POC: 0.01 ng/mL (ref 0.00–0.08)

## 2013-12-21 LAB — I-STAT CG4 LACTIC ACID, ED: Lactic Acid, Venous: 0.87 mmol/L (ref 0.5–2.2)

## 2013-12-21 LAB — URINE MICROSCOPIC-ADD ON

## 2013-12-21 MED ORDER — SODIUM CHLORIDE 0.9 % IV BOLUS (SEPSIS)
1000.0000 mL | Freq: Once | INTRAVENOUS | Status: AC
  Administered 2013-12-21: 1000 mL via INTRAVENOUS

## 2013-12-21 NOTE — ED Provider Notes (Signed)
CSN: [id number]     Arrival date & time 12/21/13  2244 History   First MD Initiated Contact with Patient 12/21/13 2257     Chief Complaint  Patient presents with  . Altered Mental Status     (Consider location/radiation/quality/duration/timing/severity/associated sxs/prior Treatment) HPI  This is an 80 year old female with a history of hypertension, glaucoma, blindness, COPD [facility] with decreased by mouth intake and paranoid delusions.  Per report, patient has not been eating or drinking for the last 5 days. She's become progressively confused more combative. Patient was pleasant on my exam. She denies any physical complaints. She is awake, alert, and oriented. She states that she's not eating because "they're trying to poison me."  She denies hallucinations. She denies any recent fevers, chest pain or shortness of breath, abdominal pain  Past Medical History  Diagnosis Date  . Blind   . Condyloma acuminatum   . 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 Hypertension   . Glaucoma   . Memory deficits 10/03/2012  . 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 Hearing deficit   . Vitamin D deficiency   . Gastroesophageal reflux disease   . COPD (chronic obstructive pulmonary disease)   . Depression    Past Surgical History  Procedure Laterality Date  . Cholecystectomy    . Eye surgery     Family History  Problem Relation Age of Onset  . Adopted: Yes   History  Substance Use Topics  . Smoking status: Former Games developermoker  . Smokeless tobacco: Not on file  . Alcohol Use: No   OB History   Grav Para Term Preterm Abortions TAB SAB Ect Mult Living                 Review of Systems  Constitutional: Negative for fever.       Dehydration  Respiratory: Negative for chest tightness and shortness of breath.   Cardiovascular: Negative for chest pain.  Gastrointestinal: Negative for nausea, vomiting and abdominal pain.  Genitourinary:  Negative for dysuria.  Skin: Negative for wound.  Neurological: Negative for headaches.  Psychiatric/Behavioral: Negative for confusion.       Paranoid  All other systems reviewed and are negative.     Allergies  Review of patient's allergies indicates no known allergies.  Home Medications   Prior to Admission medications   Medication Sig Start Date End Date Taking? Authorizing Provider  acetaminophen (TYLENOL) 325 MG tablet Take 650 mg by mouth every 6 (six) hours as needed for mild pain.   Yes Historical Provider, MD  acetaminophen (TYLENOL) 500 MG tablet Take 500 mg by mouth 2 (two) times daily.   Yes Historical Provider, MD  aspirin 81 MG tablet Take 81 mg by mouth daily.   Yes Historical Provider, MD  beclomethasone (QVAR) 80 MCG/ACT inhaler Inhale 1-3 puffs into the lungs every 12 (twelve) hours.   Yes Historical Provider, MD  Cholecalciferol (D3-1000) 1000 UNITS tablet Take 1,000 Units by mouth daily.   Yes Historical Provider, MD  dimenhyDRINATE (DRAMAMINE) 50 MG tablet Take 50 mg by mouth every 4 (four) hours as needed for nausea.   Yes Historical Provider, MD  docusate calcium (SURFAK) 240 MG capsule Take 240 mg by mouth daily.   Yes Historical Provider, MD  haloperidol (HALDOL) 0.5 MG tablet Take 0.5 mg by mouth 2 (two) times daily.   Yes Historical Provider, MD  lisinopril (PRINIVIL,ZESTRIL) 5 MG tablet  Take 5 mg by mouth daily.   Yes Historical Provider, MD  loratadine (CLARITIN) 10 MG tablet Take 10 mg by mouth daily.   Yes Historical Provider, MD  loteprednol (LOTEMAX) 0.5 % ophthalmic suspension Place 1 drop into the left eye daily.   Yes Historical Provider, MD  magnesium hydroxide (MILK OF MAGNESIA) 400 MG/5ML suspension Take 7.5 mLs by mouth daily as needed for mild constipation.   Yes Historical Provider, MD  PRAMOX-PE-GLYCERIN-PETROLATUM RE Place 1 application rectally 2 (two) times daily.   Yes Historical Provider, MD  ranitidine (ZANTAC) 300 MG tablet Take 300 mg  by mouth at bedtime.   Yes Historical Provider, MD  sertraline (ZOLOFT) 25 MG tablet Take 25 mg by mouth at bedtime.   Yes Historical Provider, MD  sodium chloride (MURO 128) 5 % ophthalmic solution Place 1 drop into the left eye 3 (three) times daily.   Yes Historical Provider, MD  traZODone (DESYREL) 50 MG tablet Take 50 mg by mouth at bedtime.   Yes Historical Provider, MD  albuterol (PROAIR HFA) 108 (90 BASE) MCG/ACT inhaler Inhale 2 puffs into the lungs every 4 (four) hours as needed for wheezing.    Historical Provider, MD   BP 140/65  Pulse 78  Temp(Src) 98.9 F (37.2 C) (Rectal)  Resp 20  SpO2 100% Physical Exam  Nursing note and vitals reviewed. Constitutional: She is oriented to person, place, and time.  Elderly, chronically ill-appearing, cachectic  HENT:  Head: Normocephalic and atraumatic.  Mucous membranes dry with crusting of the lips, poor dentition  Eyes:  purulent drainage from the left eye with conjunctival injection  Neck: Neck supple.  Cardiovascular: Normal rate, regular rhythm and normal heart sounds.   No murmur heard. Pulmonary/Chest: Effort normal and breath sounds normal. No respiratory distress. She has no wheezes.  Abdominal: Soft. Bowel sounds are normal. There is no tenderness. There is no rebound.  Musculoskeletal: She exhibits no edema.  Neurological: She is alert and oriented to person, place, and time. No cranial nerve deficit.  5 out of 5 strength in all 4 extremities  Skin: Skin is warm and dry.  Psychiatric:  Paranoid    ED Course  Procedures (including critical care time) Labs Review Labs Reviewed  COMPREHENSIVE METABOLIC PANEL - Abnormal; Notable for the following:    CO2 17 (*)    Glucose, Bld 100 (*)    BUN 26 (*)    GFR calc non Af Amer 66 (*)    GFR calc Af Amer 76 (*)    Anion gap 22 (*)    All other components within normal limits  URINALYSIS, ROUTINE W REFLEX MICROSCOPIC - Abnormal; Notable for the following:    Color,  Urine AMBER (*)    Hgb urine dipstick SMALL (*)    Bilirubin Urine MODERATE (*)    Ketones, ur 40 (*)    Protein, ur 30 (*)    All other components within normal limits  CBC WITH DIFFERENTIAL  URINE MICROSCOPIC-ADD ON  I-STAT CG4 LACTIC ACID, ED  I-STAT TROPOININ, ED    Imaging Review Ct Head Wo Contrast  12/22/2013   CLINICAL DATA:  Worsening confusion and combativeness.  EXAM: CT HEAD WITHOUT CONTRAST  TECHNIQUE: Contiguous axial images were obtained from the base of the skull through the vertex without intravenous contrast.  COMPARISON:  CT of the head performed 12/11/2013  FINDINGS: There is no evidence of acute infarction, mass lesion, or intra- or extra-axial hemorrhage on CT.  Prominence of  the ventricles and sulci reflects mild cortical volume loss. Cerebellar atrophy is noted. There appears to be mildly asymmetric right-sided volume loss. Scattered periventricular and subcortical white matter change likely reflects small vessel ischemic microangiopathy. Chronic ischemic change is noted along the external capsule bilaterally.  The brainstem and fourth ventricle are within normal limits. The cerebral hemispheres demonstrate grossly normal gray-white differentiation. No mass effect or midline shift is seen.  There is no evidence of fracture; visualized osseous structures are unremarkable in appearance. The visualized portions of the orbits are within normal limits. There is partial opacification of the maxillary sinuses bilaterally, with a likely large right-sided mucus retention cyst or polyp. The remaining paranasal sinuses and mastoid air cells are well-aerated. No significant soft tissue abnormalities are seen.  IMPRESSION: 1. No acute intracranial pathology seen on CT. 2. Mild cortical volume loss, slightly worse on the right, with scattered small vessel ischemic microangiopathy. Chronic ischemic change along the external capsule bilaterally. 3. Partial opacification of the maxillary sinuses  bilaterally, with a likely large right-sided mucus retention cyst or polyp.   Electronically Signed   By: Roanna Raider M.D.   On: 12/22/2013 00:38   Dg Chest Portable 1 View  12/21/2013   CLINICAL DATA:  Altered mental status. Not eating or drinking for 5 days.  EXAM: PORTABLE CHEST - 1 VIEW  COMPARISON:  Chest radiograph from 12/11/2013  FINDINGS: The lungs are well-aerated. There is elevation of the left hemidiaphragm. Mild vascular congestion is noted. There is no evidence of pleural effusion or pneumothorax.  The cardiomediastinal silhouette is within normal limits. No acute osseous abnormalities are seen.  IMPRESSION: Elevation of the left hemidiaphragm; mild vascular congestion noted. Lungs remain grossly clear.   Electronically Signed   By: Roanna Raider M.D.   On: 12/21/2013 23:55     EKG Interpretation   Date/Time:  Sunday December 21 2013 23:11:44 EDT Ventricular Rate:  77 PR Interval:  125 QRS Duration: 136 QT Interval:  465 QTC Calculation: 526 R Axis:   5 Text Interpretation:  Sinus rhythm Ventricular premature complex Right  bundle branch block Similar to prior Confirmed by Yamileth Hayse  MD, Toni Amend  ([id number]) on 12/21/2013 11:14:02 PM      MDM   Final diagnoses:  Dehydration  Paranoia  Conjunctivitis of left eye    This is an 80 year old female who presents with concerns for by mouth intake and combativeness. She is pleasant on my exam. She does appear dry. She has no physical complaints.  Patient was given fluids. Review of patient's chart reveals that she was recently admitted for dehydration.  EKG and imaging is reassuring. CT head is negative. Basic labwork obtained. Troponin is negative. CBC is reassuring. CMP with mild uremia with a BUN of 26, anion gap of 22, and bicarbonate of 17.  Feel this likely reflects hydration status.  Patient has tolerated fluids well and able to by mouth.  Patient had a similar presentation during last admission. It appears she was discharged the  next day following hydration. She is to followup with ophthalmology.   Called and discussed discharge with nursing facility. Patient is currently cooperative in mild paranoid he does not appear to have any delusions or acute delirium.  After history, exam, and medical workup I feel the patient has been appropriately medically screened and is safe for discharge home. Pertinent diagnoses were discussed with the patient. Patient was given return precautions.       Shon Baton, MD 12/22/13 [phone]

## 2013-12-21 NOTE — ED Notes (Signed)
Bed: [id number] Expected date:  Expected time:  Means of arrival:  Comments: EMS 80 yo female with altered mental status

## 2013-12-21 NOTE — ED Notes (Signed)
Per EMS report[facility]: Pt has not been eating or drinking for the past 5 days but for the past month pt has become more confused and combative.  EMS reports oriented to person. Pt is not normally not combative. EMS noted the pt talking to someone while enroute.

## 2013-12-22 NOTE — ED Notes (Signed)
Patient is discharged but waiting on PTAR.

## 2013-12-22 NOTE — Discharge Instructions (Signed)
Dehydration Dehydration is when you lose more fluids from the body than you take in. Vital organs such as the kidneys, brain, and heart cannot function without a proper amount of fluids and salt. Any loss of fluids from the body can cause dehydration.  Older adults are at a higher risk of dehydration than younger adults. As we age, our bodies are less able to conserve water and do not respond to temperature changes as well. Also, older adults do not become thirsty as easily or quickly. Because of this, older adults often do not realize they need to increase fluids to avoid dehydration.  CAUSES   Vomiting.  Diarrhea.  Excessive sweating.  Excessive urination.  Fever.  Certain medicines, such as blood pressure medicines called diuretics.  Poorly controlled blood sugars. SIGNS AND SYMPTOMS  Mild dehydration:  Thirst.  Dry lips.  Slightly dry mouth. Moderate dehydration:  Very dry mouth.  Sunken eyes.  Skin does not bounce back quickly when lightly pinched and released.  Dark urine and decreased urine production.  Decreased tear production.  Headache. Severe dehydration:  Very dry mouth.  Extreme thirst.  Rapid, weak pulse (more than 100 beats per minute at rest).  Cold hands and feet.  Not able to sweat in spite of heat.  Rapid breathing.  Blue lips.  Confusion and lethargy.  Difficulty being awakened.  Minimal urine production.  No tears. DIAGNOSIS  Your health care provider will diagnose dehydration based on your symptoms and your exam. Blood and urine tests will help confirm the diagnosis. The diagnostic evaluation should also identify the cause of dehydration. TREATMENT  Treatment of mild or moderate dehydration can often be done at home by increasing the amount of fluids that you drink. It is best to drink small amounts of fluid more often. Drinking too much at one time can make vomiting worse. Severe dehydration needs to be treated at the hospital.  You may be given IV fluids that contain water and electrolytes. HOME CARE INSTRUCTIONS   Ask your health care provider about specific rehydration instructions.  Drink enough fluids to keep your urine clear or pale yellow.  Drink small amounts frequently if you have nausea and vomiting.  Eat as you normally do.  Avoid:  Foods or drinks high in sugar.  Carbonated drinks.  Juice.  Extremely hot or cold fluids.  Drinks with caffeine.  Fatty, greasy foods.  Alcohol.  Tobacco.  Overeating.  Gelatin desserts.  Wash your hands well to avoid spreading bacteria and viruses.  Only take over-the-counter or prescription medicines for pain, discomfort, or fever as directed by your health care provider.  Ask your health care provider if you should continue all prescribed and over-the-counter medicines.  Keep all follow-up appointments with your health care provider. SEEK MEDICAL CARE IF:  You have abdominal pain, and it increases or stays in one area (localizes).  You have a rash, stiff neck, or severe headache.  You are irritable, sleepy, or difficult to awaken.  You are weak, dizzy, or extremely thirsty.  You have a fever. SEEK IMMEDIATE MEDICAL CARE IF:   You are unable to keep fluids down, or you get worse despite treatment.  You have frequent episodes of vomiting or diarrhea.  You have blood or green matter (bile) in your vomit.  You have blood in your stool, or your stool looks black and tarry.  You have not urinated in 6-8 hours, or you have only urinated a small amount of very dark urine.  You faint. MAKE SURE YOU:   Understand these instructions.  Will watch your condition.  Will get help right away if you are not doing well or get worse. Document Released: 07/22/2003 Document Revised: 05/06/2013 Document Reviewed: 01/06/2013 [facility] Patient Information 2015 [place], [place]. This information is not intended to replace advice given to you by your  health care provider. Make sure you discuss any questions you have with your health care provider. Paranoia Paranoia is a distrust of others that is not based on a real reason for distrust. This may reach delusional levels. This means the paranoid person feels the world is against them when there is no reason to make them feel that way. People with paranoia feel as though people around them are "out to get them".  SIMILAR MENTAL ILNESSES  Depression is a feeling as though you are down all the time. It is normal in some situations where you have just lost a loved one. It is abnormal if you are having feelings of paranoia with it.  Dementia is a physical problem with the brain in which the brain no longer works properly. There are problems with daily activities of living. Alzheimer's disease is one example of this. Dementia is also caused by old age changes in the brain which come with the death of brain cells and small strokes.  Paranoidschizophrenia. People with paranoid schizophrenia and persecutory delusional disorder have delusions in which they feel people around them are plotting against them. Persecutory delusions in paranoid schizophrenia are bizarre, sometimes grandiose, and often accompanied by auditory hallucinations. This means the person is hearing voices that are not there.  Delusionaldisorder (persecutory type). Delusions experienced by individuals with delusional disorder are more believable than those experienced by paranoid schizophrenics; they are not bizarre, though still unjustified. Individuals with delusional disorder may seem offbeat or quirky rather than mentally ill, and therefore, may never seek treatment. All of these problems usually do not allow these people to interact socially in an acceptable manner. CAUSES The cause of paranoia is often not known. It is common in people with extended abuse of:  Cocaine.  Amphetamine.  Marijuana.  Alcohol. Sometimes there is an  inherited tendency. It may be associated with stress or changes in brain chemistry. DIAGNOSIS  When paranoia is present, your caregiver may:  Refer you to a specialist.  Do a physical exam.  Perform other tests on you to make sure there are not other problems causing the paranoia including:  Physical problems.  Mental problems.  Chemical problems (other than drugs). Testing may be done to determine if there is a psychiatric disability present that can be treated with medicine. TREATMENT   Paranoia that is a symptom of a psychiatric problem should be treated by professionals.  Medicines are available which can help this disorder. Antipsychotic medicine may be prescribed by your caregiver.  Sometimes psychotherapy may be useful.  Conditions such as depression or drug abuse are treated individually. If the paranoia is caused by drug abuse, a treatment facility may be helpful. Depression may be helped by antidepressants. PROGNOSIS   Paranoid people are difficult to treat because of their belief that everyone is out to get them or harm them. Because of this mistrust, they often must be talked into entering treatment by a trusted family member or friend. They may not want to take medicine as they may see this as an attempt to poison them.  Gradual gains in the trust of a therapist or caregiver helps in a  successful treatment plan.  Some people with PPD or persecutory delusional disorder function in society without treatment in limited fashion. Document Released: 05/04/2003 Document Revised: 07/24/2011 Document Reviewed: 01/07/2008 [facility] Patient Information 2015 [place]. This information is not intended to replace advice given to you by your health care provider. Make sure you discuss any questions you have with your health care provider.

## 2014-01-27 NOTE — Progress Notes (Signed)
12/12/13 1400  SLP G-Codes **NOT FOR INPATIENT CLASS**  Functional Assessment Tool Used (clinical judgement)  Functional Limitations Swallowing  Swallow Current Status (Z6109) [facility]  Swallow Goal Status (U0454) [facility]  Swallow Discharge Status (U9811) [facility]  SLP Evaluations  $ SLP Speech Visit 1 Procedure  SLP Evaluations  $BSS Swallow 1 Procedure

## 2014-03-11 ENCOUNTER — Emergency Department (HOSPITAL_COMMUNITY): Payer: Medicare Other

## 2014-03-11 ENCOUNTER — Inpatient Hospital Stay (HOSPITAL_COMMUNITY)
Admission: EM | Admit: 2014-03-11 | Discharge: 2014-03-14 | Disposition: A | Discharge status: Skilled Nursing Facility | Payer: Medicare Other | Attending: Internal Medicine | Admitting: Internal Medicine

## 2014-03-11 ENCOUNTER — Encounter (HOSPITAL_COMMUNITY): Payer: Self-pay | Admitting: Emergency Medicine

## 2014-03-11 DIAGNOSIS — D649 Anemia, unspecified: Secondary | ICD-10-CM | POA: Diagnosis present

## 2014-03-11 DIAGNOSIS — G9341 Metabolic encephalopathy: Secondary | ICD-10-CM | POA: Diagnosis not present

## 2014-03-11 DIAGNOSIS — Z79899 Other long term (current) drug therapy: Secondary | ICD-10-CM

## 2014-03-11 DIAGNOSIS — F329 Major depressive disorder, single episode, unspecified: Secondary | ICD-10-CM | POA: Diagnosis present

## 2014-03-11 DIAGNOSIS — N179 Acute kidney failure, unspecified: Secondary | ICD-10-CM | POA: Diagnosis present

## 2014-03-11 DIAGNOSIS — J440 Chronic obstructive pulmonary disease with acute lower respiratory infection: Secondary | ICD-10-CM | POA: Diagnosis present

## 2014-03-11 DIAGNOSIS — H540 Blindness, both eyes: Secondary | ICD-10-CM | POA: Diagnosis present

## 2014-03-11 DIAGNOSIS — B9620 Unspecified Escherichia coli [E. coli] as the cause of diseases classified elsewhere: Secondary | ICD-10-CM | POA: Diagnosis present

## 2014-03-11 DIAGNOSIS — Y95 Nosocomial condition: Secondary | ICD-10-CM | POA: Diagnosis present

## 2014-03-11 DIAGNOSIS — F0390 Unspecified dementia without behavioral disturbance: Secondary | ICD-10-CM | POA: Diagnosis present

## 2014-03-11 DIAGNOSIS — J438 Other emphysema: Secondary | ICD-10-CM

## 2014-03-11 DIAGNOSIS — G9340 Encephalopathy, unspecified: Secondary | ICD-10-CM | POA: Diagnosis present

## 2014-03-11 DIAGNOSIS — E860 Dehydration: Secondary | ICD-10-CM | POA: Diagnosis not present

## 2014-03-11 DIAGNOSIS — Z7982 Long term (current) use of aspirin: Secondary | ICD-10-CM

## 2014-03-11 DIAGNOSIS — R509 Fever, unspecified: Secondary | ICD-10-CM

## 2014-03-11 DIAGNOSIS — I10 Essential (primary) hypertension: Secondary | ICD-10-CM | POA: Diagnosis present

## 2014-03-11 DIAGNOSIS — H547 Unspecified visual loss: Secondary | ICD-10-CM

## 2014-03-11 DIAGNOSIS — N390 Urinary tract infection, site not specified: Secondary | ICD-10-CM | POA: Diagnosis present

## 2014-03-11 DIAGNOSIS — J189 Pneumonia, unspecified organism: Principal | ICD-10-CM | POA: Diagnosis present

## 2014-03-11 DIAGNOSIS — Z87891 Personal history of nicotine dependence: Secondary | ICD-10-CM

## 2014-03-11 DIAGNOSIS — H409 Unspecified glaucoma: Secondary | ICD-10-CM

## 2014-03-11 DIAGNOSIS — K219 Gastro-esophageal reflux disease without esophagitis: Secondary | ICD-10-CM | POA: Diagnosis present

## 2014-03-11 DIAGNOSIS — R413 Other amnesia: Secondary | ICD-10-CM

## 2014-03-11 DIAGNOSIS — A630 Anogenital (venereal) warts: Secondary | ICD-10-CM

## 2014-03-11 LAB — MRSA PCR SCREENING: MRSA BY PCR: POSITIVE — AB

## 2014-03-11 LAB — COMPREHENSIVE METABOLIC PANEL
ALT: 10 U/L (ref 0–35)
AST: 16 U/L (ref 0–37)
Albumin: 3 g/dL — ABNORMAL LOW (ref 3.5–5.2)
Alkaline Phosphatase: 47 U/L (ref 39–117)
Anion gap: 13 (ref 5–15)
BUN: 23 mg/dL (ref 6–23)
CHLORIDE: 106 meq/L (ref 96–112)
CO2: 21 mEq/L (ref 19–32)
CREATININE: 1.16 mg/dL — AB (ref 0.50–1.10)
Calcium: 8.5 mg/dL (ref 8.4–10.5)
GFR calc non Af Amer: 43 mL/min — ABNORMAL LOW (ref >90)
GFR, EST AFRICAN AMERICAN: 50 mL/min — AB (ref >90)
Glucose, Bld: 93 mg/dL (ref 70–99)
Potassium: 4 mEq/L (ref 3.7–5.3)
Sodium: 140 mEq/L (ref 137–147)
Total Bilirubin: 0.6 mg/dL (ref 0.3–1.2)
Total Protein: 6.2 g/dL (ref 6.0–8.3)

## 2014-03-11 LAB — URINALYSIS, ROUTINE W REFLEX MICROSCOPIC
Bilirubin Urine: NEGATIVE
GLUCOSE, UA: NEGATIVE mg/dL
HGB URINE DIPSTICK: NEGATIVE
KETONES UR: NEGATIVE mg/dL
Nitrite: POSITIVE — AB
PH: 5.5 (ref 5.0–8.0)
Protein, ur: NEGATIVE mg/dL
Specific Gravity, Urine: 1.019 (ref 1.005–1.030)
Urobilinogen, UA: 1 mg/dL (ref 0.0–1.0)

## 2014-03-11 LAB — CBC WITH DIFFERENTIAL/PLATELET
BASOS PCT: 0 % (ref 0–1)
Basophils Absolute: 0 10*3/uL (ref 0.0–0.1)
Eosinophils Absolute: 0 10*3/uL (ref 0.0–0.7)
Eosinophils Relative: 0 % (ref 0–5)
HEMATOCRIT: 32.8 % — AB (ref 36.0–46.0)
HEMOGLOBIN: 10.7 g/dL — AB (ref 12.0–15.0)
Lymphocytes Relative: 6 % — ABNORMAL LOW (ref 12–46)
Lymphs Abs: 0.5 10*3/uL — ABNORMAL LOW (ref 0.7–4.0)
MCH: 29.9 pg (ref 26.0–34.0)
MCHC: 32.6 g/dL (ref 30.0–36.0)
MCV: 91.6 fL (ref 78.0–100.0)
MONO ABS: 0.6 10*3/uL (ref 0.1–1.0)
MONOS PCT: 8 % (ref 3–12)
Neutro Abs: 7.2 10*3/uL (ref 1.7–7.7)
Neutrophils Relative %: 86 % — ABNORMAL HIGH (ref 43–77)
Platelets: 124 10*3/uL — ABNORMAL LOW (ref 150–400)
RBC: 3.58 MIL/uL — ABNORMAL LOW (ref 3.87–5.11)
RDW: 13.2 % (ref 11.5–15.5)
WBC: 8.4 10*3/uL (ref 4.0–10.5)

## 2014-03-11 LAB — RETICULOCYTES
RBC.: 3.59 MIL/uL — ABNORMAL LOW (ref 3.87–5.11)
RETIC COUNT ABSOLUTE: 35.9 10*3/uL (ref 19.0–186.0)
Retic Ct Pct: 1 % (ref 0.4–3.1)

## 2014-03-11 LAB — IRON AND TIBC
IRON: 58 ug/dL (ref 42–135)
SATURATION RATIOS: 27 % (ref 20–55)
TIBC: 214 ug/dL — AB (ref 250–470)
UIBC: 156 ug/dL (ref 125–400)

## 2014-03-11 LAB — URINE MICROSCOPIC-ADD ON

## 2014-03-11 LAB — VITAMIN B12: Vitamin B-12: 881 pg/mL (ref 211–911)

## 2014-03-11 LAB — I-STAT CG4 LACTIC ACID, ED: Lactic Acid, Venous: 0.62 mmol/L (ref 0.5–2.2)

## 2014-03-11 MED ORDER — GUAIFENESIN 100 MG/5ML PO SOLN: 200.0000 mg | Freq: Four times a day (QID) | ORAL | Status: DC | PRN

## 2014-03-11 MED ORDER — HYPROMELLOSE (GONIOSCOPIC) 2.5 % OP SOLN: 1.0000 [drp] | Freq: Two times a day (BID) | OPHTHALMIC | Status: DC

## 2014-03-11 MED ORDER — SODIUM CHLORIDE 0.9 % IV SOLN
INTRAVENOUS | Status: DC
  Administered 2014-03-11 – 2014-03-13 (×3): via INTRAVENOUS

## 2014-03-11 MED ORDER — DEXTROSE 5 % IV SOLN
1.0000 g | INTRAVENOUS | Status: DC
  Administered 2014-03-12 – 2014-03-13 (×2): 1 g via INTRAVENOUS
  Filled 2014-03-11 (×2): qty 1

## 2014-03-11 MED ORDER — DIVALPROEX SODIUM 250 MG PO DR TAB
250.0000 mg | DELAYED_RELEASE_TABLET | Freq: Two times a day (BID) | ORAL | Status: DC
  Administered 2014-03-11 – 2014-03-14 (×7): 250 mg via ORAL
  Filled 2014-03-11 (×8): qty 1

## 2014-03-11 MED ORDER — POLYVINYL ALCOHOL 1.4 % OP SOLN
1.0000 [drp] | Freq: Two times a day (BID) | OPHTHALMIC | Status: DC
  Administered 2014-03-11 – 2014-03-14 (×6): 1 [drp] via OPHTHALMIC
  Filled 2014-03-11: qty 15

## 2014-03-11 MED ORDER — LORATADINE 10 MG PO TABS
10.0000 mg | ORAL_TABLET | Freq: Every day | ORAL | Status: DC
  Administered 2014-03-11 – 2014-03-14 (×4): 10 mg via ORAL
  Filled 2014-03-11 (×4): qty 1

## 2014-03-11 MED ORDER — SODIUM CHLORIDE 0.9 % IV BOLUS (SEPSIS)
250.0000 mL | Freq: Once | INTRAVENOUS | Status: AC
  Administered 2014-03-11: 250 mL via INTRAVENOUS

## 2014-03-11 MED ORDER — ONDANSETRON HCL 4 MG/2ML IJ SOLN: 4.0000 mg | Freq: Four times a day (QID) | INTRAMUSCULAR | Status: DC | PRN

## 2014-03-11 MED ORDER — CETYLPYRIDINIUM CHLORIDE 0.05 % MT LIQD
7.0000 mL | Freq: Two times a day (BID) | OROMUCOSAL | Status: DC
  Administered 2014-03-11 – 2014-03-14 (×7): 7 mL via OROMUCOSAL

## 2014-03-11 MED ORDER — SODIUM CHLORIDE 0.9 % IV SOLN
INTRAVENOUS | Status: DC
Start: 2014-03-11 — End: 2014-03-11
  Administered 2014-03-11: 08:00:00 via INTRAVENOUS

## 2014-03-11 MED ORDER — ACETAMINOPHEN 650 MG RE SUPP
650.0000 mg | Freq: Once | RECTAL | Status: AC
  Administered 2014-03-11: 650 mg via RECTAL
  Filled 2014-03-11: qty 1

## 2014-03-11 MED ORDER — DEXTROSE 5 % IV SOLN
1.0000 g | Freq: Once | INTRAVENOUS | Status: AC
  Administered 2014-03-11: 1 g via INTRAVENOUS
  Filled 2014-03-11: qty 1

## 2014-03-11 MED ORDER — POTASSIUM CHLORIDE CRYS ER 20 MEQ PO TBCR
20.0000 meq | EXTENDED_RELEASE_TABLET | Freq: Every day | ORAL | Status: DC
  Administered 2014-03-11 – 2014-03-14 (×4): 20 meq via ORAL
  Filled 2014-03-11 (×4): qty 1

## 2014-03-11 MED ORDER — VITAMIN D 1000 UNITS PO TABS
1000.0000 [IU] | ORAL_TABLET | Freq: Every day | ORAL | Status: DC
  Administered 2014-03-11 – 2014-03-14 (×4): 1000 [IU] via ORAL
  Filled 2014-03-11 (×4): qty 1

## 2014-03-11 MED ORDER — LOTEPREDNOL ETABONATE 0.5 % OP SUSP
1.0000 [drp] | Freq: Every day | OPHTHALMIC | Status: DC
  Administered 2014-03-12 – 2014-03-14 (×3): 1 [drp] via OPHTHALMIC
  Filled 2014-03-11: qty 5

## 2014-03-11 MED ORDER — ACETAMINOPHEN 325 MG PO TABS: 650.0000 mg | ORAL_TABLET | Freq: Four times a day (QID) | ORAL | Status: DC | PRN

## 2014-03-11 MED ORDER — ENOXAPARIN SODIUM 30 MG/0.3ML ~~LOC~~ SOLN
30.0000 mg | SUBCUTANEOUS | Status: DC
Start: 2014-03-11 — End: 2014-03-12
  Administered 2014-03-11 – 2014-03-12 (×2): 30 mg via SUBCUTANEOUS
  Filled 2014-03-11 (×2): qty 0.3

## 2014-03-11 MED ORDER — RISPERIDONE 2 MG PO TBDP
2.0000 mg | ORAL_TABLET | Freq: Two times a day (BID) | ORAL | Status: DC
  Administered 2014-03-11 – 2014-03-14 (×7): 2 mg via ORAL
  Filled 2014-03-11 (×8): qty 1

## 2014-03-11 MED ORDER — VANCOMYCIN HCL 500 MG IV SOLR
500.0000 mg | INTRAVENOUS | Status: AC
  Administered 2014-03-11: 500 mg via INTRAVENOUS
  Filled 2014-03-11: qty 500

## 2014-03-11 MED ORDER — VANCOMYCIN HCL 500 MG IV SOLR: 500.0000 mg | INTRAVENOUS | Status: DC

## 2014-03-11 MED ORDER — CHLORHEXIDINE GLUCONATE 0.12 % MT SOLN
15.0000 mL | Freq: Two times a day (BID) | OROMUCOSAL | Status: DC
  Administered 2014-03-11 – 2014-03-14 (×6): 15 mL via OROMUCOSAL
  Filled 2014-03-11 (×8): qty 15

## 2014-03-11 MED ORDER — ASPIRIN 81 MG PO CHEW
81.0000 mg | CHEWABLE_TABLET | Freq: Every day | ORAL | Status: DC
  Administered 2014-03-12 – 2014-03-14 (×3): 81 mg via ORAL
  Filled 2014-03-11 (×4): qty 1

## 2014-03-11 MED ORDER — ACETAMINOPHEN 650 MG RE SUPP: 650.0000 mg | Freq: Four times a day (QID) | RECTAL | Status: DC | PRN

## 2014-03-11 MED ORDER — ONDANSETRON HCL 4 MG PO TABS: 4.0000 mg | ORAL_TABLET | Freq: Four times a day (QID) | ORAL | Status: DC | PRN

## 2014-03-11 NOTE — Progress Notes (Signed)
CSW updated patient niece, Patrina LeveringRhonda Freedland, pt HCPOA/Durable POA regarding.   Byrd HesselbachKristen Dayvon Dax, LCSW [phone]  ED CSW 03/11/2014 1222pm

## 2014-03-11 NOTE — H&P (Signed)
History and Physical  Catherine Howell ZOX:[id number] DOB: September 02, 1930 DOA: 03/11/2014   PCP: Kaleen Mask, MD   Chief Complaint: Fever  HPI:  80 year old female with a history of dementia, hypertension, COPD, depression presents from [facility] after the patient was found to have a temperature 103.11F. The patient is unable to provide any significant history due to her dementia. All of this history is obtained from review of the medical record and speaking with the emergency department physician. From the record, it also appeared that the patient was not acting at her baseline at her nursing facility. As result, the patient was brought to the emergency department. In the emergency department, the patient had a rectal temperature 100.26F. She was awake and conversant, albeit confused. The patient does complain of coughing and shortness of breath but denied any headache, chest pain, abdominal pain. She complained of some dysuria. In the emergency department, the patient had a soft blood pressure of 98/62, oxygen saturation was 96% on room air. CBC revealed hemoglobin 10.7. Serum creatinine was 1.16. Hepatic enzymes were unremarkable. Urinalysis showed 3-6 WBC, positive nitrites. Lactic acid was 0.62. Chest x-ray showed right lower lobe opacity.  I attempted to call the patient's POA--no answer. Assessment/Plan: HCAP -Start vancomycin and cefepime  -Speech therapy evaluation as there is risk for aspiration  -Pulmonary hygiene  -IV fluids Dysuria -Unclear reliability as the patient fluctuates in her complaints whether she is actually having dysuria  -Patient has nitrites in the urine but this is nonspecific  -continue antibiotic pending culture data Normocytic anemia -Hemoglobin has dropped over 3 g since 12/21/2013 -Hemoccult stool -However, it appears that the patient's baseline hemoglobin is approximately 12 -Check anemia panel Hypertension -Hold all antihypertensive  medications as the patient's blood pressure is soft Dementia -Continue Depakote and Risperdal  AKI -Secondary to dehydration -Baseline creatinine 0.8-0.9 -IV fluids       Past Medical History  Diagnosis Date  . Blind   . Condyloma acuminatum   . 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 Hypertension   . Glaucoma   . Memory deficits 10/03/2012  . 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 Hearing deficit   . Vitamin D deficiency   . Gastroesophageal reflux disease   . COPD (chronic obstructive pulmonary disease)   . Depression    Past Surgical History  Procedure Laterality Date  . Cholecystectomy    . Eye surgery     Social History:  reports that she has quit smoking. She has never used smokeless tobacco. She reports that she does not drink alcohol or use illicit drugs.   Family History  Problem Relation Age of Onset  . Adopted: Yes     No Known Allergies    Prior to Admission medications   Medication Sig Start Date End Date Taking? Authorizing Provider  acetaminophen (TYLENOL) 325 MG tablet Take 650 mg by mouth every 6 (six) hours as needed for mild pain, fever or headache.    Yes Historical Provider, MD  acetaminophen (TYLENOL) 500 MG tablet Take 500 mg by mouth 2 (two) times daily.   Yes Historical Provider, MD  albuterol (PROAIR HFA) 108 (90 BASE) MCG/ACT inhaler Inhale 2 puffs into the lungs every 4 (four) hours as needed for wheezing.   Yes Historical Provider, MD  alum & mag hydroxide-simeth (MAALOX/MYLANTA) 200-200-20 MG/5ML suspension Take 30 mLs by mouth daily as needed for indigestion or heartburn.   Yes  Historical Provider, MD  amLODipine (NORVASC) 10 MG tablet Take 10 mg by mouth daily.   Yes Historical Provider, MD  aspirin 81 MG chewable tablet Chew 81 mg by mouth daily with breakfast.   Yes Historical Provider, MD  Cholecalciferol (D3-1000) 1000 UNITS tablet Take 1,000 Units by mouth daily.   Yes Historical Provider, MD    dimenhyDRINATE (DRAMAMINE) 50 MG tablet Take 50 mg by mouth at bedtime as needed for nausea. May also take every 4 hours if needed for nausea and vomiting   Yes Historical Provider, MD  divalproex (DEPAKOTE) 250 MG DR tablet Take 250 mg by mouth 2 (two) times daily.   Yes Historical Provider, MD  guaifenesin (ROBITUSSIN) 100 MG/5ML syrup Take 200 mg by mouth every 6 (six) hours as needed for cough.   Yes Historical Provider, MD  hydrocortisone cream (PREPARATION H HYDROCORTISONE) 1 % Apply 1 application topically 2 (two) times daily.   Yes Historical Provider, MD  hydroxypropyl methylcellulose / hypromellose (ISOPTO TEARS / GONIOVISC) 2.5 % ophthalmic solution Place 1 drop into the left eye 2 (two) times daily.   Yes Historical Provider, MD  lactobacillus acidophilus (BACID) TABS tablet Take 1 tablet by mouth 3 (three) times daily.   Yes Historical Provider, MD  loperamide (IMODIUM) 2 MG capsule Take by mouth as needed for diarrhea or loose stools.   Yes Historical Provider, MD  loratadine (CLARITIN) 10 MG tablet Take 10 mg by mouth daily.   Yes Historical Provider, MD  loteprednol (LOTEMAX) 0.5 % ophthalmic suspension Place 1 drop into the left eye daily.   Yes Historical Provider, MD  magnesium hydroxide (MILK OF MAGNESIA) 400 MG/5ML suspension Take 22.5 mLs by mouth daily as needed for mild constipation.    Yes Historical Provider, MD  meclizine (ANTIVERT) 25 MG tablet Take 25 mg by mouth every 6 (six) hours as needed for dizziness.   Yes Historical Provider, MD  neomycin-bacitracin-polymyxin (NEOSPORIN) ointment Apply 1 application topically as needed for wound care.   Yes Historical Provider, MD  potassium chloride SA (K-DUR,KLOR-CON) 20 MEQ tablet Take 20 mEq by mouth daily.   Yes Historical Provider, MD  risperiDONE (RISPERDAL M-TABS) 2 MG disintegrating tablet Take 2 mg by mouth 2 (two) times daily.   Yes Historical Provider, MD  sodium phosphate (FLEET) 7-19 GM/118ML ENEM Place 1 enema  rectally daily as needed for mild constipation or severe constipation.   Yes Historical Provider, MD    Review of Systems:  Very limited and unobtainable secondary to patient's mental status  Physical Exam: Filed Vitals:   03/11/14 0755 03/11/14 0815 03/11/14 0900 03/11/14 0940  BP: 91/55 98/63 98/62  94/64  Pulse: 76 77 72 69  Temp:      TempSrc:      Resp:  17  17  Height:      Weight:      SpO2: 96% 94%  97%   General:  A&O x 1, NAD, nontoxic, pleasant/cooperative Head/Eye: No conjunctival hemorrhage, no icterus, [place]/AT, No nystagmus ENT:  No icterus,  No thrush, good dentition, no pharyngeal exudate Neck:  No masses, no lymphadenpathy, no meningismus  CV:  RRR, no rub, no gallop, no S3 Lung:  bibasilar rales, right greater than left  Abdomen: soft/NT, +BS, nondistended, no peritoneal signs Ext: No cyanosis, No rashes, No petechiae, No lymphangitis, trace LE edema;  examination of the patient's presacral and sacral area negative for open wounds   Labs on Admission:  Basic Metabolic Panel:  Recent Labs Lab  03/11/14 0804  NA 140  K 4.0  CL 106  CO2 21  GLUCOSE 93  BUN 23  CREATININE 1.16*  CALCIUM 8.5   Liver Function Tests:  Recent Labs Lab 03/11/14 0804  AST 16  ALT 10  ALKPHOS 47  BILITOT 0.6  PROT 6.2  ALBUMIN 3.0*   No results found for this basename: LIPASE, AMYLASE,  in the last 168 hours No results found for this basename: AMMONIA,  in the last 168 hours CBC:  Recent Labs Lab 03/11/14 0804  WBC 8.4  NEUTROABS 7.2  HGB 10.7*  HCT 32.8*  MCV 91.6  PLT 124*   Cardiac Enzymes: No results found for this basename: CKTOTAL, CKMB, CKMBINDEX, TROPONINI,  in the last 168 hours BNP: No components found with this basename: POCBNP,  CBG: No results found for this basename: GLUCAP,  in the last 168 hours  Radiological Exams on Admission: Dg Chest Portable 1 View  03/11/2014   CLINICAL DATA:  Dementia, fever  EXAM: PORTABLE CHEST - 1 VIEW   COMPARISON:  12/21/2013  FINDINGS: There is elevation of the left diaphragm. There is bilateral mild interstitial thickening likely chronic. There is right lower lobe airspace disease concerning for pneumonia. There is no pleural effusion or pneumothorax. The heart and mediastinal contours are unremarkable.  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  IMPRESSION: Hazy right lower lobe airspace disease concerning for pneumonia. Recommend followup radiography in 4-6 weeks, to document complete resolution following adequate medical therapy. If there is not complete resolution, then recommend further evaluation with CT of the chest to exclude underlying pathology.   Electronically Signed   By: Elige KoHetal  Patel   On: 03/11/2014 08:15         Time spent:60 minutes Code Status:   FULL Family Communication:  No Family at bedside   Nadiah Corbit, DO  [facility] Pager [phone]  If 7PM-7AM, please contact night-coverage [email or web address] Password [email or web address] 03/11/2014, 9:51 AM
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
## 2014-03-11 NOTE — ED Provider Notes (Signed)
CSN: [id number]     Arrival date & time 03/11/14  0730 History   First MD Initiated Contact with Patient 03/11/14 [phone]     Chief Complaint  Patient presents with  . Urinary Tract Infection      Patient is a 80 y.o. female presenting with urinary tract infection. The history is provided by the patient, the nursing home and the EMS personnel. The history is limited by the condition of the patient (Hx dementia).  Urinary Tract Infection  Pt was seen at 0740.  Per EMS, NH report, and pt:  NH states pt had a fever to "103" this morning, was "not acting like she usually does," and was reporting "painful urination." Pt has hx of UTI approximately 2 months ago. EMS states pt's room at the NH was very hot and pt was under multiple blankets on their arrival to the scene. Pt herself states "I'm ok" and "I'm just tired." Denies abd pain, no reported vomiting/diarrhea, no SOB/cough.    Past Medical History  Diagnosis Date  . Blind   . Condyloma acuminatum   . 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 Hypertension   . Glaucoma   . Memory deficits 10/03/2012  . 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 Hearing deficit   . Vitamin D deficiency   . Gastroesophageal reflux disease   . COPD (chronic obstructive pulmonary disease)   . Depression    Past Surgical History  Procedure Laterality Date  . Cholecystectomy    . Eye surgery     Family History  Problem Relation Age of Onset  . Adopted: Yes   History  Substance Use Topics  . Smoking status: Former Games developermoker  . Smokeless tobacco: Not on file  . Alcohol Use: No    Review of Systems  Unable to perform ROS: Dementia      Allergies  Review of patient's allergies indicates no known allergies.  Home Medications   Prior to Admission medications   Medication Sig Start Date End Date Taking? Authorizing Provider  acetaminophen (TYLENOL) 325 MG tablet Take 650 mg by mouth every 6 (six) hours as needed for mild  pain.    Historical Provider, MD  acetaminophen (TYLENOL) 500 MG tablet Take 500 mg by mouth 2 (two) times daily.    Historical Provider, MD  albuterol (PROAIR HFA) 108 (90 BASE) MCG/ACT inhaler Inhale 2 puffs into the lungs every 4 (four) hours as needed for wheezing.    Historical Provider, MD  aspirin 81 MG tablet Take 81 mg by mouth daily.    Historical Provider, MD  beclomethasone (QVAR) 80 MCG/ACT inhaler Inhale 1-3 puffs into the lungs every 12 (twelve) hours.    Historical Provider, MD  Cholecalciferol (D3-1000) 1000 UNITS tablet Take 1,000 Units by mouth daily.    Historical Provider, MD  dimenhyDRINATE (DRAMAMINE) 50 MG tablet Take 50 mg by mouth every 4 (four) hours as needed for nausea.    Historical Provider, MD  docusate calcium (SURFAK) 240 MG capsule Take 240 mg by mouth daily.    Historical Provider, MD  haloperidol (HALDOL) 0.5 MG tablet Take 0.5 mg by mouth 2 (two) times daily.    Historical Provider, MD  lisinopril (PRINIVIL,ZESTRIL) 5 MG tablet Take 5 mg by mouth daily.    Historical Provider, MD  loratadine (CLARITIN) 10 MG tablet Take 10 mg by mouth daily.    Historical Provider, MD  loteprednol (LOTEMAX) 0.5 % ophthalmic suspension Place 1  drop into the left eye daily.    Historical Provider, MD  magnesium hydroxide (MILK OF MAGNESIA) 400 MG/5ML suspension Take 7.5 mLs by mouth daily as needed for mild constipation.    Historical Provider, MD  PRAMOX-PE-GLYCERIN-PETROLATUM RE Place 1 application rectally 2 (two) times daily.    Historical Provider, MD  ranitidine (ZANTAC) 300 MG tablet Take 300 mg by mouth at bedtime.    Historical Provider, MD  sertraline (ZOLOFT) 25 MG tablet Take 25 mg by mouth at bedtime.    Historical Provider, MD  sodium chloride (MURO 128) 5 % ophthalmic solution Place 1 drop into the left eye 3 (three) times daily.    Historical Provider, MD  traZODone (DESYREL) 50 MG tablet Take 50 mg by mouth at bedtime.    Historical Provider, MD   BP 91/55   Pulse 76  Temp(Src) 100.7 F (38.2 C) (Rectal)  Resp 18  Wt 78 lb (35.381 kg)  SpO2 96% Physical Exam 0745: Physical examination:  Nursing notes reviewed; Vital signs and O2 SAT reviewed;  Constitutional: Well developed, Well nourished, In no acute distress; Head:  Normocephalic, atraumatic; Eyes: EOMI, PERRL, No scleral icterus; ENMT: Mouth and pharynx normal, Mucous membranes dry; Neck: Supple, Full range of motion, No lymphadenopathy; Cardiovascular: Regular rate and rhythm, No gallop; Respiratory: Breath sounds clear & equal bilaterally, No wheezes.  Speaking full sentences with ease, Normal respiratory effort/excursion; Chest: Nontender, Movement normal; Abdomen: Soft, Nontender, Nondistended, Normal bowel sounds; Genitourinary: No CVA tenderness; Extremities: Pulses normal, No tenderness, No edema, No calf edema or asymmetry.; Neuro: Awake, alert, confused per hx dementia. +HOH, blind. No facial droop. Speech clear. Moves all extremities on stretcher spontaneously.; Skin: Color normal, Warm, Dry.    ED Course  Procedures     EKG Interpretation None      MDM  MDM Reviewed: previous chart, nursing note and vitals Reviewed previous: labs Interpretation: labs and x-ray   Results for orders placed during the hospital encounter of 03/11/14  URINALYSIS, ROUTINE W REFLEX MICROSCOPIC      Result Value Ref Range   Color, Urine YELLOW  YELLOW   APPearance CLEAR  CLEAR   Specific Gravity, Urine 1.019  1.005 - 1.030   pH 5.5  5.0 - 8.0   Glucose, UA NEGATIVE  NEGATIVE mg/dL   Hgb urine dipstick NEGATIVE  NEGATIVE   Bilirubin Urine NEGATIVE  NEGATIVE   Ketones, ur NEGATIVE  NEGATIVE mg/dL   Protein, ur NEGATIVE  NEGATIVE mg/dL   Urobilinogen, UA 1.0  0.0 - 1.0 mg/dL   Nitrite POSITIVE (*) NEGATIVE   Leukocytes, UA SMALL (*) NEGATIVE  CBC WITH DIFFERENTIAL      Result Value Ref Range   WBC 8.4  4.0 - 10.5 K/uL   RBC 3.58 (*) 3.87 - 5.11 MIL/uL   Hemoglobin 10.7 (*) 12.0 - 15.0  g/dL   HCT 81.1 (*) 91.4 - 78.2 %   MCV 91.6  78.0 - 100.0 fL   MCH 29.9  26.0 - 34.0 pg   MCHC 32.6  30.0 - 36.0 g/dL   RDW 95.6  21.3 - 08.6 %   Platelets 124 (*) 150 - 400 K/uL   Neutrophils Relative % 86 (*) 43 - 77 %   Neutro Abs 7.2  1.7 - 7.7 K/uL   Lymphocytes Relative 6 (*) 12 - 46 %   Lymphs Abs 0.5 (*) 0.7 - 4.0 K/uL   Monocytes Relative 8  3 - 12 %   Monocytes Absolute 0.6  0.1 - 1.0 K/uL   Eosinophils Relative 0  0 - 5 %   Eosinophils Absolute 0.0  0.0 - 0.7 K/uL   Basophils Relative 0  0 - 1 %   Basophils Absolute 0.0  0.0 - 0.1 K/uL  COMPREHENSIVE METABOLIC PANEL      Result Value Ref Range   Sodium 140  137 - 147 mEq/L   Potassium 4.0  3.7 - 5.3 mEq/L   Chloride 106  96 - 112 mEq/L   CO2 21  19 - 32 mEq/L   Glucose, Bld 93  70 - 99 mg/dL   BUN 23  6 - 23 mg/dL   Creatinine, Ser 7.821.16 (*) 0.50 - 1.10 mg/dL   Calcium 8.5  8.4 - 95.610.5 mg/dL   Total Protein 6.2  6.0 - 8.3 g/dL   Albumin 3.0 (*) 3.5 - 5.2 g/dL   AST 16  0 - 37 U/L   ALT 10  0 - 35 U/L   Alkaline Phosphatase 47  39 - 117 U/L   Total Bilirubin 0.6  0.3 - 1.2 mg/dL   GFR calc non Af Amer 43 (*) >90 mL/min   GFR calc Af Amer 50 (*) >90 mL/min   Anion gap 13  5 - 15  URINE MICROSCOPIC-ADD ON      Result Value Ref Range   Squamous Epithelial / LPF RARE  RARE   WBC, UA 3-6  <3 WBC/hpf   Bacteria, UA MANY (*) RARE  I-STAT CG4 LACTIC ACID, ED      Result Value Ref Range   Lactic Acid, Venous 0.62  0.5 - 2.2 mmol/L   Dg Chest Portable 1 View 03/11/2014   CLINICAL DATA:  Dementia, fever  EXAM: PORTABLE CHEST - 1 VIEW  COMPARISON:  12/21/2013  FINDINGS: There is elevation of the left diaphragm. There is bilateral mild interstitial thickening likely chronic. There is right lower lobe airspace disease concerning for pneumonia. There is no pleural effusion or pneumothorax. The heart and mediastinal contours are unremarkable.  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  IMPRESSION: Hazy right  lower lobe airspace disease concerning for pneumonia. Recommend followup radiography in 4-6 weeks, to document complete resolution following adequate medical therapy. If there is not complete resolution, then recommend further evaluation with CT of the chest to exclude underlying pathology.   Electronically Signed   By: Elige KoHetal  Patel   On: 03/11/2014 08:15    0900:  APAP given for fever. Will start abx for UTI and HCAP. Judicious IVF given for soft BP and elevated Cr from pt's baseline.  T/C to [facility]. Arbutus Leasat, case discussed, including:  HPI, pertinent PM/SHx, VS/PE, dx testing, ED course and treatment:  Agreeable to admit, requests he will come to the ED for evaluation.    Samuel JesterKathleen Syeda Prickett, DO 03/12/14 [phone]

## 2014-03-11 NOTE — Progress Notes (Signed)
CSW attempted to complete psychosocial assessment with patient, however per discussion with RN patient is not able to complete assessment due to condition and is sleeping. CSW spoke with patient niece, Harley HallmarkRondivoo Freedland [phone]), who is patient power of attorney durable and health care.  Ms. Delorse LimberFreedland confirms [facility] and plans for patient to return once medically stable. At this time, patient disposition still pending. CSW to follow up with Ms. Freeldand once disposition determined. Ms. Delorse LimberFreedland stated she is at work but available if needed and can provide [facility] when medically stable. CSW continuing to folow to assist with pt needs.   Byrd HesselbachKristen Calin Ellery, LCSW [phone]  ED CSW 03/11/2014 833am

## 2014-03-11 NOTE — Progress Notes (Signed)
Utilization Review completed.  Jhoel Stieg RN CM  

## 2014-03-11 NOTE — Progress Notes (Signed)
ANTIBIOTIC CONSULT NOTE - INITIAL  Pharmacy Consult for vancomycin, cefepime Indication: pneumonia  No Known Allergies  Patient Measurements: Height: 4' 9.87" (147 cm) Weight: 78 lb (35.381 kg) IBW/kg (Calculated) : 40.61   Vital Signs: Temp: 100.7 F (38.2 C) (10/28 0744) Temp Source: Rectal (10/28 0744) BP: 98/63 mmHg (10/28 0815) Pulse Rate: 77 (10/28 0815) Intake/Output from previous day:   Intake/Output from this shift:    Labs:  Recent Labs  03/11/14 0804  WBC 8.4  HGB 10.7*  PLT 124*  CREATININE 1.16*   Estimated Creatinine Clearance: 21.3 ml/min (by C-G formula based on Cr of 1.16). No results found for this basename: VANCOTROUGH, VANCOPEAK, VANCORANDOM, GENTTROUGH, GENTPEAK, GENTRANDOM, TOBRATROUGH, TOBRAPEAK, TOBRARND, AMIKACINPEAK, AMIKACINTROU, AMIKACIN,  in the last 72 hours   Microbiology: No results found for this or any previous visit (from the past 720 hour(s)).  Medical History: Past Medical History  Diagnosis Date  . Blind   . Condyloma acuminatum   . 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 Hypertension   . Glaucoma   . Memory deficits 10/03/2012  . 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 Hearing deficit   . Vitamin D deficiency   . Gastroesophageal reflux disease   . COPD (chronic obstructive pulmonary disease)   . Depression     Medications:  Scheduled:   Infusions:  . sodium chloride 75 mL/hr at 03/11/14 0813  . ceFEPime (MAXIPIME) IV     Assessment: 80 yo NH patient presents with UTI and hx UTI. [facility] To start vancomycin and cefepime for UTI and HCAP.  10/28 >> vancomycin >> 10/28 >> cefepime >>   [facility]  WBC WNL  SCr 1.16 with est CrCl of 21 ml/min  Goal of Therapy:  Vancomycin trough level 15-20 mcg/ml  Plan:  1) Vancomycin 500mg  IV q48 2) Cefepime 1g IV q24 for CrCl 11-29 ml//min   Hessie KnowsJustin M Deforrest Bogle, PharmD, BCPS Pager [phone] 03/11/2014  8:56 AM

## 2014-03-11 NOTE — ED Notes (Signed)
Bed: [id number] Expected date:  Expected time:  Means of arrival:  Comments: EMS 42F ?UTI, fever 103

## 2014-03-11 NOTE — Plan of Care (Signed)
Problem: Phase I Progression Outcomes Goal: Pain controlled with appropriate interventions Outcome: Completed/Met Date Met:  03/11/14 Currently has no c/o pain.

## 2014-03-11 NOTE — ED Notes (Signed)
Report given to Kenny, RN

## 2014-03-11 NOTE — Evaluation (Signed)
Clinical/Bedside Swallow Evaluation Patient Details  Name: Catherine Howell MRN: [id number] Date of Birth: 08/01/1930  Today's Date: 03/11/2014 Time: 1300-1308 SLP Time Calculation (min): 8 min  Past Medical History:  Past Medical History  Diagnosis Date  . Blind   . Condyloma acuminatum   . 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 Hypertension   . Glaucoma   . Memory deficits 10/03/2012  . 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 Hearing deficit   . Vitamin D deficiency   . Gastroesophageal reflux disease   . COPD (chronic obstructive pulmonary disease)   . Depression    Past Surgical History:  Past Surgical History  Procedure Laterality Date  . Cholecystectomy    . Eye surgery     HPI:  80 year old female with a history of dementia, hypertension, COPD, depression [facility] after the patient was found to have a temperature 103.0.  MD history is obtained from review of the medical record and speaking with the emergency department physician. From the record, it also appeared that the patient was not acting at her baseline at her nursing facility. As result, the patient was brought to the emergency department. In the emergency department, the patient had a rectal temperature 100.74F. The patient does complain of coughing and shortness of breath to MD per note.  CXR concerning for Hazy right lower lobe airspace disease concerning for pneumonia. Swallow evaluation ordered due to concern for aspiration.     Assessment / Plan / Recommendation Clinical Impression  Pt observed being fed lunch by nurse tech.  No overt indications of aspiration - mild delay in oral transiting, mastication- suspect cognitive based. Nurse tech was fabulously helping pt to compensate for suspected mild oral dysphagia - monitoring to assure pt swallowed prior to administering more intake. No focal CN deficits apparent.  Risk for aspiration is chronic however due to  multiple risk factors including dementia, reliance on others for feeding, co-morbidities but with strict aspiration precautions, pt tolerating intake well.  Given findings of RLL pna, SLP to follow up briefly for family education for dysphagia mitigation strategies. SLP cannot rule out overt silent aspiration at bedside especially given COPD, dementia. If overt silent aspiration is a concern, Md may desire to order MBS. Otherwise continue soft/thin diet with full assistance.    Aspiration Risk  Mild    Diet Recommendation Dysphagia 3 (Mechanical Soft);Thin liquid   Liquid Administration via: Cup;Straw Medication Administration:  (as tolerated) Supervision: Full supervision/cueing for compensatory strategies Compensations: Slow rate;Small sips/bites;Check for pocketing    Other  Recommendations Oral Care Recommendations: Oral care BID   Follow Up Recommendations  None    Frequency and Duration min 1 x/week  1 week   Pertinent Vitals/Pain Afebrile, decreased     Swallow Study Prior Functional Status   see HHX     General Date of Onset: 03/11/14 HPI: 80 year old female with a history of dementia, hypertension, COPD, depression [facility] after the patient was found to have a temperature 103.0.  MD history is obtained from review of the medical record and speaking with the emergency department physician. From the record, it also appeared that the patient was not acting at her baseline at her nursing facility. As result, the patient was brought to the emergency department. In the emergency department, the patient had a rectal temperature 100.74F. The patient does complain of coughing and shortness of breath to MD per note.  CXR concerning for Hazy right lower lobe airspace disease concerning for pneumonia. Swallow evaluation ordered due to concern for aspiration.   Type of Study: Bedside swallow evaluation Diet Prior to this Study: Dysphagia 3 (soft);Thin  liquids Temperature Spikes Noted: Yes History of Recent Intubation: No Behavior/Cognition: Alert;Cooperative;Pleasant mood;Requires cueing;Hard of hearing Oral Cavity - Dentition: Adequate natural dentition (missing few teeth) Self-Feeding Abilities: Total assist Patient Positioning: Upright in bed Baseline Vocal Quality: Clear Volitional Cough: Strong Volitional Swallow: Able to elicit    Oral/Motor/Sensory Function Overall Oral Motor/Sensory Function: Appears within functional limits for tasks assessed   Ice Chips Ice chips: Not tested   Thin Liquid Thin Liquid: Within functional limits Oral Phase Impairments: Impaired anterior to posterior transit;Reduced lingual movement/coordination Oral Phase Functional Implications: Prolonged oral transit Other Comments: minimal delay in swallow but no indications of aspiration/airway compromise    Nectar Thick Nectar Thick Liquid: Not tested   Honey Thick Honey Thick Liquid: Not tested   Puree Puree: Within functional limits Presentation: Spoon   Solid   GO    Solid: Impaired Oral Phase Impairments: Reduced lingual movement/coordination;Impaired anterior to posterior transit Oral Phase Functional Implications: Other (comment) (delayed oral transitinig, slow mastication but effective) Pharyngeal Phase Impairments: Suspected delayed Fredric MareSwallow       Marlana Mckowen, MS [facility] SLP [phone]

## 2014-03-11 NOTE — Progress Notes (Signed)
BSE completed, full report to follow.  Pt observed being fed lunch by nurse tech.  No overt indications of aspiration - mild delay in oral transiting, mastication- suspect cognitive based.  Nurse tech was fabulously helping pt to compensate for dysphagia - monitoring to assure pt swallowed prior to administering more intake.   Risk for aspiration is chronic due to multiple risk factors including dementia, reliance on others for feeding, co-morbidities but with strict aspiration precautions, pt tolerating intake well.  Given findings of RLL pna, SLP to follow up briefly for family education for dysphagia mitigation strategies.    SLP cannot rule out overt silent aspiration at bedside especially given COPD, dementia.  If overt silent aspiration is a concern, Md may desire to order MBS.  Otherwise continue soft/thin diet with full assistance.  Donavan Burnetamara Bronnie Vasseur, MS [facility] SLP [phone]

## 2014-03-12 LAB — BASIC METABOLIC PANEL
Anion gap: 10 (ref 5–15)
BUN: 20 mg/dL (ref 6–23)
CHLORIDE: 108 meq/L (ref 96–112)
CO2: 22 meq/L (ref 19–32)
Calcium: 8.3 mg/dL — ABNORMAL LOW (ref 8.4–10.5)
Creatinine, Ser: 0.94 mg/dL (ref 0.50–1.10)
GFR calc non Af Amer: 55 mL/min — ABNORMAL LOW (ref >90)
GFR, EST AFRICAN AMERICAN: 64 mL/min — AB (ref >90)
Glucose, Bld: 83 mg/dL (ref 70–99)
POTASSIUM: 4 meq/L (ref 3.7–5.3)
SODIUM: 140 meq/L (ref 137–147)

## 2014-03-12 LAB — GLUCOSE, CAPILLARY
GLUCOSE-CAPILLARY: 81 mg/dL (ref 70–99)
GLUCOSE-CAPILLARY: 103 mg/dL — AB (ref 70–99)
Glucose-Capillary: 112 mg/dL — ABNORMAL HIGH (ref 70–99)

## 2014-03-12 LAB — CBC
HEMATOCRIT: 30.3 % — AB (ref 36.0–46.0)
HEMOGLOBIN: 10 g/dL — AB (ref 12.0–15.0)
MCH: 30.5 pg (ref 26.0–34.0)
MCHC: 33 g/dL (ref 30.0–36.0)
MCV: 92.4 fL (ref 78.0–100.0)
Platelets: 105 10*3/uL — ABNORMAL LOW (ref 150–400)
RBC: 3.28 MIL/uL — ABNORMAL LOW (ref 3.87–5.11)
RDW: 13.3 % (ref 11.5–15.5)
WBC: 4.7 10*3/uL (ref 4.0–10.5)

## 2014-03-12 LAB — CLOSTRIDIUM DIFFICILE BY PCR: CDIFFPCR: NEGATIVE

## 2014-03-12 LAB — FOLATE RBC: RBC Folate: 1101 ng/mL — ABNORMAL HIGH (ref >280)

## 2014-03-12 LAB — OCCULT BLOOD X 1 CARD TO LAB, STOOL: FECAL OCCULT BLD: NEGATIVE

## 2014-03-12 MED ORDER — ENSURE PUDDING PO PUDG
1.0000 | Freq: Three times a day (TID) | ORAL | Status: DC
  Administered 2014-03-13 – 2014-03-14 (×4): 1 via ORAL
  Filled 2014-03-12 (×8): qty 1

## 2014-03-12 MED ORDER — CHLORHEXIDINE GLUCONATE CLOTH 2 % EX PADS
6.0000 | MEDICATED_PAD | Freq: Every day | CUTANEOUS | Status: DC
  Administered 2014-03-12 – 2014-03-13 (×2): 6 via TOPICAL

## 2014-03-12 MED ORDER — MUPIROCIN 2 % EX OINT
1.0000 "application " | TOPICAL_OINTMENT | Freq: Two times a day (BID) | CUTANEOUS | Status: DC
  Administered 2014-03-12 – 2014-03-14 (×5): 1 via NASAL
  Filled 2014-03-12: qty 22

## 2014-03-12 MED ORDER — VANCOMYCIN HCL IN DEXTROSE 750-5 MG/150ML-% IV SOLN
750.0000 mg | INTRAVENOUS | Status: DC
  Administered 2014-03-12 – 2014-03-13 (×2): 750 mg via INTRAVENOUS
  Filled 2014-03-12 (×3): qty 150

## 2014-03-12 MED ORDER — ENOXAPARIN SODIUM 40 MG/0.4ML ~~LOC~~ SOLN
40.0000 mg | SUBCUTANEOUS | Status: DC
  Administered 2014-03-13 – 2014-03-14 (×2): 40 mg via SUBCUTANEOUS
  Filled 2014-03-12 (×2): qty 0.4

## 2014-03-12 NOTE — Progress Notes (Signed)
Patient checked for stool impaction that could be causing diarrhea.  No impaction felt.  Allayne ButcherMiller, Marco Adelson [facility]  03/12/2014  3:45 PM

## 2014-03-12 NOTE — Progress Notes (Signed)
Clinical Social Work Department BRIEF PSYCHOSOCIAL ASSESSMENT 03/12/2014  Patient:  Catherine CrumbleMOORE,Catherine Howell     Account Number:  [id number]     Admit date:  03/11/2014  Clinical Social Worker:  Dennison BullaGERBER,Kazi Reppond, LCSW  Date/Time:  03/12/2014 04:00 PM  Referred by:  Physician  Date Referred:  03/12/2014 Referred for  ALF Placement   Other Referral:   Interview type:  Family Other interview type:    PSYCHOSOCIAL DATA Living Status:  FACILITY Admitted from facility:  [facility] Level of care:  Assisted Living Primary support name:  Bjorn LoserRhonda Primary support relationship to patient:  FAMILY Degree of support available:   Strong    CURRENT CONCERNS Current Concerns  Post-Acute Placement   Other Concerns:    SOCIAL WORK ASSESSMENT / PLAN CSW received referral due to patient being admitted from a facility. CSW received hand off from ED CSW reporting that patient and family were assessed.    [facility] Niece is caregiver and is very involved with patient's care. Niece is aware that PT has been ordered and inquired if a higher level of care was needed. CSW explained that once PT evaluates patient, CSW can contact her and discuss if patient can return to ALF vs possible SNF placement. Niece aware and agreeable to plans.    CSW will continue to follow.   Assessment/plan status:  Psychosocial Support/Ongoing Assessment of Needs Other assessment/ plan:   Information/referral to community resources:   ALF vs SNF    PATIENT'S/FAMILY'S RESPONSE TO PLAN OF CARE: Patient aware of plans. Niece thanked CSW for information and reports patient has never been to SNF in the past so they are not familiar with process. Niece is open to SNF if higher level of care is needed and asked to be kept informed of recommendations.       Caswell BeachHolly Kaydi Howell, KentuckyLCSW [phone]

## 2014-03-12 NOTE — Progress Notes (Signed)
Pt's C-diff PCR was negative.  Notified Infection Prevention and spoke with Charna ArcherMelissa Morgan, RN.  Okay to d'c enteric precautions.  Allayne ButcherMiller, Lima Chillemi [facility]  03/12/2014  3:19 PM

## 2014-03-12 NOTE — Plan of Care (Signed)
Problem: Phase III Progression Outcomes Goal: Pain controlled on oral analgesia Outcome: Not Applicable Date Met:  63/01/60 Patient denies pain at this time.

## 2014-03-12 NOTE — Progress Notes (Signed)
Patient now having foul smelling diarrheal stools.  C-diff nurse driven protocol initiated.  Enteric isolation initiated and stool sample for C-diff PCR sent and in progress.  Education provided to patient but unclear how much she comprehended.  Allayne ButcherMiller, Renatta Shrieves [facility]  03/12/2014  2:01 PM

## 2014-03-12 NOTE — Progress Notes (Signed)
ANTIBIOTIC CONSULT NOTE   Pharmacy Consult for vancomycin, cefepime Indication: pneumonia  No Known Allergies  Patient Measurements: Height: 4' 9.5" (146.1 cm) Weight: 118 lb 6.4 oz (53.706 kg) IBW/kg (Calculated) : 39.75  Vital Signs: Temp: 98.1 F (36.7 C) (10/29 1034) Temp Source: Oral (10/29 1034) BP: 112/75 mmHg (10/29 1034) Pulse Rate: 70 (10/29 1034) Intake/Output from previous day: 10/28 0701 - 10/29 0700 In: 1523.8 [P.O.:440; I.V.:683.8; IV Piggyback:400] Out: -  Intake/Output from this shift: Total I/O In: 200 [P.O.:200] Out: -   Labs:  Recent Labs  03/11/14 0804 03/12/14 0430  WBC 8.4 4.7  HGB 10.7* 10.0*  PLT 124* 105*  CREATININE 1.16* 0.94   Estimated Creatinine Clearance: 33.6 ml/min (by C-G formula based on Cr of 0.94). No results found for this basename: VANCOTROUGH, Leodis BinetVANCOPEAK, VANCORANDOM, GENTTROUGH, GENTPEAK, GENTRANDOM, TOBRATROUGH, TOBRAPEAK, TOBRARND, AMIKACINPEAK, AMIKACINTROU, AMIKACIN,  in the last 72 hours   Microbiology: Recent Results (from the past 720 hour(s))  MRSA PCR SCREENING     Status: Abnormal   Collection Time    03/11/14 12:25 PM      Result Value Ref Range Status   MRSA by PCR POSITIVE (*) NEGATIVE Final   Comment:            The GeneXpert MRSA Assay (FDA     approved for NASAL specimens     only), is one component of a     comprehensive MRSA colonization     surveillance program. It is not     intended to diagnose MRSA     infection nor to guide or     monitor treatment for     MRSA infections.     RESULT CALLED TO, READ BACK BY AND VERIFIED WITH:     Feliciana RossettiDESHAZO LAURA RN 03/11/2014 1659 BY BOVELL,T.   Medications:  Scheduled:  . antiseptic oral rinse  7 mL Mouth Rinse q12n4p  . aspirin  81 mg Oral Q breakfast  . ceFEPime (MAXIPIME) IV  1 g Intravenous Q24H  . chlorhexidine  15 mL Mouth Rinse BID  . Chlorhexidine Gluconate Cloth  6 each Topical Q0600  . cholecalciferol  1,000 Units Oral Daily  . divalproex  250  mg Oral BID  . enoxaparin (LOVENOX) injection  30 mg Subcutaneous Q24H  . loratadine  10 mg Oral Daily  . loteprednol  1 drop Left Eye Daily  . mupirocin ointment  1 application Nasal BID  . polyvinyl alcohol  1 drop Left Eye BID  . potassium chloride SA  20 mEq Oral Daily  . risperiDONE  2 mg Oral BID  . [START ON 03/13/2014] vancomycin  500 mg Intravenous Q48H   Assessment: 80 yo NH patient presents with UTI and hx UTI. [facility] To start vancomycin and cefepime for UTI and HCAP.  10/28 >> vancomycin >> 10/28 >> cefepime >>   Tmax 103 at NH  WBC WNL  SCr 1.16-> 0.94, Cl ~ 34 CG  Goal of Therapy:  Vancomycin trough level 15-20 mcg/ml  Plan:  1) change Vancomycin to 750mg  q24 hr 2) Cefepime 1g IV q24   Otho BellowsGreen, Devann Cribb L PharmD Pager [phone] 03/12/2014, 1:47 PM

## 2014-03-12 NOTE — Progress Notes (Signed)
INITIAL NUTRITION ASSESSMENT  DOCUMENTATION CODES Per approved criteria  -Not Applicable   INTERVENTION:  Recommend re-weighing pt to assess current weight status  Provide Ensure Pudding po TID, each supplement provides 170 kcal and 4 grams of protein  RD to continue to monitor  NUTRITION DIAGNOSIS: Inadequate oral intake related to altered mental status as evidenced by poor PO intake <25%.   Goal: Pt to meet >/= 90% of their estimated nutrition needs   Monitor:  PO and supplemental intake, weight, labs, I/O's  Reason for Assessment: Consult for nutritional assessment  Admitting Dx: fever  ASSESSMENT: 80 year old female with a history of dementia, hypertension, COPD, depression [facility] Swallow evaluation ordered due to concern for aspiration.   SLP saw pt 10/28: recommend soft diet/thin liquids with feeding assistance  Pt is in room during visit, pt was unable to provide any history. Pt with Hx of dementia and blindness.  PO intake: 15% Two weights are provided on 10/28, 118 lb and 78 lb. Recommend weighing pt again to clarify current weight status.  Upon visual assessment of pt, pt shows signs of fat and muscle depletion in temporal and clavicle regions.  Labs reviewed.  Height: Ht Readings from Last 1 Encounters:  03/11/14 4' 9.5" (1.461 m)    Weight: Wt Readings from Last 1 Encounters:  03/11/14 118 lb 6.4 oz (53.706 kg)  -may be an error?  Ideal Body Weight: 97 lb  % Ideal Body Weight: 122%  Wt Readings from Last 10 Encounters:  03/11/14 118 lb 6.4 oz (53.706 kg)  12/12/13 72 lb 8.5 oz (32.9 kg)  10/03/12 130 lb (58.968 kg)  08/14/12 128 lb (58.06 kg)    Usual Body Weight: Unable to obtain  % Usual Body Weight: NA  BMI:  Body mass index is 25.16 kg/(m^2).  Estimated Nutritional Needs: Kcal: 1500-1700 Protein: 80-90g Fluid: 1.5L/day  Skin: intact  Diet Order: Parke SimmersBland  EDUCATION NEEDS: -No education needs identified  at this time   Intake/Output Summary (Last 24 hours) at 03/12/14 1116 Last data filed at 03/12/14 1000  Gross per 24 hour  Intake 1073.75 ml  Output      0 ml  Net 1073.75 ml    Last BM: 10/29  Labs:   Recent Labs Lab 03/11/14 0804 03/12/14 0430  NA 140 140  K 4.0 4.0  CL 106 108  CO2 21 22  BUN 23 20  CREATININE 1.16* 0.94  CALCIUM 8.5 8.3*  GLUCOSE 93 83    CBG (last 3)   Recent Labs  03/12/14 0753  GLUCAP 81    Scheduled Meds: . antiseptic oral rinse  7 mL Mouth Rinse q12n4p  . aspirin  81 mg Oral Q breakfast  . ceFEPime (MAXIPIME) IV  1 g Intravenous Q24H  . chlorhexidine  15 mL Mouth Rinse BID  . Chlorhexidine Gluconate Cloth  6 each Topical Q0600  . cholecalciferol  1,000 Units Oral Daily  . divalproex  250 mg Oral BID  . enoxaparin (LOVENOX) injection  30 mg Subcutaneous Q24H  . loratadine  10 mg Oral Daily  . loteprednol  1 drop Left Eye Daily  . mupirocin ointment  1 application Nasal BID  . polyvinyl alcohol  1 drop Left Eye BID  . potassium chloride SA  20 mEq Oral Daily  . risperiDONE  2 mg Oral BID  . [START ON 03/13/2014] vancomycin  500 mg Intravenous Q48H    Continuous Infusions: . sodium chloride 75 mL/hr at 03/11/14  1212    Past Medical History  Diagnosis Date  . Blind   . Condyloma acuminatum   . 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 Hypertension   . Glaucoma   . Memory deficits 10/03/2012  . 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 Hearing deficit   . Vitamin D deficiency   . Gastroesophageal reflux disease   . COPD (chronic obstructive pulmonary disease)   . Depression     Past Surgical History  Procedure Laterality Date  . Cholecystectomy    . Eye surgery      Catherine FrancoLindsey Shalisha Clausing, MS, RD, LDN Pager: [phone] After Hours Pager: [phone]

## 2014-03-12 NOTE — Progress Notes (Signed)
Speech Language Pathology Treatment: Dysphagia  Patient Details Name: Catherine Howell MRN: [id number] DOB: [birth year] Today's Date: 03/12/2014 Time: 2353-6144 SLP Time Calculation (min): 17 min  Assessment / Plan / Recommendation Clinical Impression  Pt required moderate cues to dry swallow to aid secretion clearance during today's session due to poor awareness and question motor planning issues.  She was able to respond and conduct dry swallow in timely fashion however.  Nursing student reports pt with good tolerance of breakfast today = she observed delayed swallow response but no overt coughing.    Pt accepted ice cream and water during today's session.  Mild delay in swallow noted *suspect oral transit delay* but no s/s of aspiration and clear voice noted throughout.  Use of pt assisting self feeding *hand over hand with cup/spoon* may maximize her airway protection due to neuro input.    Oral care after meals indicated due to pt secretion retention that may lend to aspiration.  Recommend pt continue dys3/thin diet with precautions in place.  Educated Presenter, broadcasting and pt to recommendations- no further SLP indicated.  Please reorder if desire, thanks for this referral.       HPI HPI: 80 year old female with a history of dementia, hypertension, COPD, depression presents from [facility] after the patient was found to have a temperature 103.0.  MD history is obtained from review of the medical record and speaking with the emergency department physician. From the record, it also appeared that the patient was not acting at her baseline at her nursing facility. As result, the patient was brought to the emergency department. In the emergency department, the patient had a rectal temperature 100.59F. The patient does complain of coughing and shortness of breath to MD per note.  CXR concerning for Hazy right lower lobe airspace disease concerning for pneumonia. Swallow evaluation ordered due to  concern for aspiration.     Pertinent Vitals Pain Assessment: No/denies pain  SLP Plan  All goals met    Recommendations Diet recommendations: Dysphagia 3 (mechanical soft);Thin liquid Liquids provided via: Cup;Straw Medication Administration:  (as tolerated) Supervision: Full supervision/cueing for compensatory strategies;Staff to assist with self feeding Compensations: Slow rate;Small sips/bites;Check for pocketing (allow time for pt to swallow, delayed swallow noted, clean mouth after meals, cue pt to swallow secretions) Postural Changes and/or Swallow Maneuvers: Seated upright 90 degrees;Upright 30-60 min after meal              Oral Care Recommendations: Other (Comment) (oral care after meals) Follow up Recommendations: None Plan: All goals met    [place], [facility] SLP [phone]

## 2014-03-12 NOTE — Progress Notes (Signed)
PROGRESS NOTE  Catherine Crumblernestine E Brickley ZOX:[id number] DOB: 04/13/1931 DOA: 03/11/2014 PCP: Kaleen MaskELKINS,[name], MD  Assessment/Plan: HCAP  -Start vancomycin and cefepime  -appreciate Speech therapy eval-->continue present soft diet -Pulmonary hygiene  -IV fluids  Dysuria  -Unclear reliability as the patient fluctuates in her complaints whether she is actually having dysuria  -Patient has nitrites in the urine but this is nonspecific  -continue antibiotic pending culture data Acute encephalopathy -improving with fluid and abx  Normocytic anemia  -Hemoglobin has dropped over 3 g since 12/21/2013  -Hemoccult stool  -However, it appears that the patient's baseline hemoglobin is approximately 12  -Serum B12--881, RBC folate 1101, iron saturation 27% -Hemoglobin trending down partly due to dilution Hypertension  -Hold all antihypertensive medications as the patient's blood pressure is soft  Dementia  -Continue Depakote and Risperdal  AKI  -Secondary to dehydration  -Baseline creatinine 0.8-0.9  -IV fluids-->improving Diarrhea -C. difficile PCR  Family Communication:   Pt at beside Disposition Plan:   Home when medically stable       Procedures/Studies: Dg Chest Portable 1 View  03/11/2014   CLINICAL DATA:  Dementia, fever  EXAM: PORTABLE CHEST - 1 VIEW  COMPARISON:  12/21/2013  FINDINGS: There is elevation of the left diaphragm. There is bilateral mild interstitial thickening likely chronic. There is right lower lobe airspace disease concerning for pneumonia. There is no pleural effusion or pneumothorax. The heart and mediastinal contours are unremarkable.  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  IMPRESSION: Hazy right lower lobe airspace disease concerning for pneumonia. Recommend followup radiography in 4-6 weeks, to document complete resolution following adequate medical therapy. If there is not complete resolution, then recommend further evaluation  with CT of the chest to exclude underlying pathology.   Electronically Signed   By: Elige KoHetal  Patel   On: 03/11/2014 08:15         Subjective: Patient denies fevers, chills, headache, chest pain, dyspnea, nausea, vomiting, abdominal pain, dysuria, hematuria. Patient is having diarrhea   Objective: Filed Vitals:   03/12/14 0754 03/12/14 0944 03/12/14 1034 03/12/14 1358  BP: 102/66 112/68 112/75 127/81  Pulse: 60 68 70 65  Temp: 98.6 F (37 C) 98.4 F (36.9 C) 98.1 F (36.7 C) 98.7 F (37.1 C)  TempSrc: Oral Oral Oral Oral  Resp: 12 15 15 16   Height:      Weight:      SpO2: 100% 100% 98% 99%    Intake/Output Summary (Last 24 hours) at 03/12/14 1421 Last data filed at 03/12/14 1000  Gross per 24 hour  Intake    640 ml  Output      0 ml  Net    640 ml   Weight change:  Exam:   General:  Pt is alert, follows commands appropriately, not in acute distress  HEENT: No icterus, No thrush, No meningismus, [place]/AT  Cardiovascular: RRR, S1/S2, no rubs, no gallops  Respiratory: Poor inspiratory effort. Bibasilar rales, right greater than left  Abdomen: Soft/+BS, non tender, non distended, no guarding  Extremities: trace LE edema, No lymphangitis, No petechiae, No rashes, no synovitis  Data Reviewed: Basic Metabolic Panel:  Recent Labs Lab 03/11/14 0804 03/12/14 0430  NA 140 140  K 4.0 4.0  CL 106 108  CO2 21 22  GLUCOSE 93 83  BUN 23 20  CREATININE 1.16* 0.94  CALCIUM 8.5 8.3*   Liver Function Tests:  Recent Labs Lab 03/11/14 0804  AST  16  ALT 10  ALKPHOS 47  BILITOT 0.6  PROT 6.2  ALBUMIN 3.0*   No results found for this basename: LIPASE, AMYLASE,  in the last 168 hours No results found for this basename: AMMONIA,  in the last 168 hours CBC:  Recent Labs Lab 03/11/14 0804 03/12/14 0430  WBC 8.4 4.7  NEUTROABS 7.2  --   HGB 10.7* 10.0*  HCT 32.8* 30.3*  MCV 91.6 92.4  PLT 124* 105*   Cardiac Enzymes: No results found for this basename:  CKTOTAL, CKMB, CKMBINDEX, TROPONINI,  in the last 168 hours BNP: No components found with this basename: POCBNP,  CBG:  Recent Labs Lab 03/12/14 0753 03/12/14 1131  GLUCAP 81 103*    Recent Results (from the past 240 hour(s))  MRSA PCR SCREENING     Status: Abnormal   Collection Time    03/11/14 12:25 PM      Result Value Ref Range Status   MRSA by PCR POSITIVE (*) NEGATIVE Final   Comment:            The GeneXpert MRSA Assay (FDA     approved for NASAL specimens     only), is one component of a     comprehensive MRSA colonization     surveillance program. It is not     intended to diagnose MRSA     infection nor to guide or     monitor treatment for     MRSA infections.     RESULT CALLED TO, READ BACK BY AND VERIFIED WITH:     Feliciana RossettiDESHAZO LAURA RN 03/11/2014 1659 BY BOVELL,T.     Scheduled Meds: . antiseptic oral rinse  7 mL Mouth Rinse q12n4p  . aspirin  81 mg Oral Q breakfast  . ceFEPime (MAXIPIME) IV  1 g Intravenous Q24H  . chlorhexidine  15 mL Mouth Rinse BID  . Chlorhexidine Gluconate Cloth  6 each Topical Q0600  . cholecalciferol  1,000 Units Oral Daily  . divalproex  250 mg Oral BID  . enoxaparin (LOVENOX) injection  30 mg Subcutaneous Q24H  . feeding supplement (ENSURE)  1 Container Oral TID WC  . loratadine  10 mg Oral Daily  . loteprednol  1 drop Left Eye Daily  . mupirocin ointment  1 application Nasal BID  . polyvinyl alcohol  1 drop Left Eye BID  . potassium chloride SA  20 mEq Oral Daily  . risperiDONE  2 mg Oral BID  . vancomycin  750 mg Intravenous Q24H   Continuous Infusions: . sodium chloride 75 mL/hr at 03/12/14 1252     Dangelo Guzzetta, DO  [facility] Pager [phone]  If 7PM-7AM, please contact night-coverage [email or web address] Password [email or web address] 03/12/2014, 2:21 PM   LOS: 1 day

## 2014-03-13 DIAGNOSIS — N390 Urinary tract infection, site not specified: Secondary | ICD-10-CM

## 2014-03-13 LAB — URINE CULTURE: Colony Count: >=100000

## 2014-03-13 MED ORDER — SODIUM CHLORIDE 0.9 % IV SOLN
250.0000 mg | Freq: Four times a day (QID) | INTRAVENOUS | Status: DC
  Administered 2014-03-13 – 2014-03-14 (×4): 250 mg via INTRAVENOUS
  Filled 2014-03-13 (×5): qty 250

## 2014-03-13 NOTE — Progress Notes (Signed)
Clinical Social Work  CSW called and left a message with niece in order to provide bed offers. CSW will continue to follow.  [place], [place] [phone]

## 2014-03-13 NOTE — Progress Notes (Addendum)
Clinical Social Work Department CLINICAL SOCIAL WORK PLACEMENT NOTE 03/13/2014  Patient:  Catherine Howell,Catherine Howell  Account Number:  [id number] Admit date:  03/11/2014  Clinical Social Worker:  Unk LightningHOLLY Sevannah Madia, LCSW  Date/time:  03/13/2014 10:40 AM  Clinical Social Work is seeking post-discharge placement for this patient at the following level of care:   SKILLED NURSING   (*CSW will update this form in Epic as items are completed)   03/13/2014  Patient/family provided with [facility] of Clinical Social Work's list of facilities offering this level of care within the geographic area requested by the patient (or if unable, by the patient's family).  03/13/2014  Patient/family informed of their freedom to choose among providers that offer the needed level of care, that participate in Medicare, Medicaid or managed care program needed by the patient, have an available bed and are willing to accept the patient.  03/13/2014  Patient/family informed of [facility]' ownership interest in [facility], as well as of the fact that they are under no obligation to receive care at this facility.  PASARR submitted to EDS on 03/13/2014 PASARR number received on 03/13/2014  FL2 transmitted to all facilities in geographic area requested by pt/family on  03/13/2014 FL2 transmitted to all facilities within larger geographic area on   Patient informed that his/her managed care company has contracts with or will negotiate with  certain facilities, including the following:     Patient/family informed of bed offers received:  03/13/14 [facility] Physician recommends and patient chooses bed at    [facility] on  03/14/14 Patient to be transferred to facility by non emergency ambulance Patient and family notified of transfer on 03/14/14 Name of family member notified:  Niece Bjorn LoserRhonda via phone  The following physician request  were entered in Epic:   Additional Comments:

## 2014-03-13 NOTE — Progress Notes (Signed)
Clinical Social Work  CSW spoke with [facility]. CSW spoke with admissions at SNF who is agreeable to accept patient on Saturday or when patient is medically stable. CSW will continue to follow and will assist as needed.  [place] A[place], [place] [phone]

## 2014-03-13 NOTE — Evaluation (Signed)
Physical Therapy Evaluation Patient Details Name: Catherine Howell MRN: [id number] DOB: 06/22/1930 Today's Date: 03/13/2014   History of Present Illness  Pt is an 80 year old female admitted 03/11/14 for HCAP and acute encephalopathy with hx of dementia, blindness, HOH, COPD, HTN, GERD  Clinical Impression  Pt currently with functional limitations due to the deficits listed below (see PT Problem List).  Pt will benefit from skilled PT to increase their independence and safety with mobility to allow discharge to the venue listed below.  Pt requiring assist for bed mobility and transfers, also unable to march in place today so deferred ambulation for pt safety.  Pt reports independent with ambulation at ALF.  Recommend ST-SNF upon d/c.     Follow Up Recommendations SNF    Equipment Recommendations  None recommended by PT    Recommendations for Other Services       Precautions / Restrictions Precautions Precautions: Fall Restrictions Weight Bearing Restrictions: No      Mobility  Bed Mobility Overal bed mobility: Needs Assistance Bed Mobility: Supine to Sit     Supine to sit: Mod assist     General bed mobility comments: pt able to follow verbal and tactile cues and initiate mobility, assist to scoot to EOB  Transfers Overall transfer level: Needs assistance Equipment used: Rolling walker (2 wheeled) Transfers: Sit to/from Stand Sit to Stand: Min assist         General transfer comment: verbal cues for technique, pt held onto RW due to to blindness, posterior lean against bed observed with transfers, performed transfers x3, pt unable to march in place so deferred ambulation for pt safety  Ambulation/Gait                Stairs            Wheelchair Mobility    Modified Rankin (Stroke Patients Only)       Balance Overall balance assessment: Needs assistance         Standing balance support: Bilateral upper extremity supported Standing  balance-Leahy Scale: Poor Standing balance comment: min assist for static standing with RW                             Pertinent Vitals/Pain Pain Assessment: No/denies pain    Home Living Family/patient expects to be discharged to:: Assisted living               Home Equipment: None      Prior Function Level of Independence: Needs assistance   Gait / Transfers Assistance Needed: pt reports independent with mobility, denies using assistive device, ambulates to dining hall           Hand Dominance        Extremity/Trunk Assessment               Lower Extremity Assessment: Generalized weakness         Communication   Communication: HOH;Other (comment) (blind)  Cognition Arousal/Alertness: Awake/alert Behavior During Therapy: Flat affect Overall Cognitive Status: History of cognitive impairments - at baseline ( pt follows commands)                      General Comments      Exercises        Assessment/Plan    PT Assessment Patient needs continued PT services  PT Diagnosis Difficulty walking;Generalized weakness   PT Problem List Decreased strength;Decreased activity  tolerance;Decreased mobility;Decreased balance;Decreased knowledge of use of DME  PT Treatment Interventions Gait training;DME instruction;Functional mobility training;Patient/family education;Therapeutic activities;Therapeutic exercise   PT Goals (Current goals can be found in the Care Plan section) Acute Rehab PT Goals Patient Stated Goal: pt agreaeble to mobilize and strengthening PT Goal Formulation: With patient Time For Goal Achievement: 03/20/14 Potential to Achieve Goals: Good    Frequency Min 3X/week   Barriers to discharge        Co-evaluation               End of Session Equipment Utilized During Treatment: Gait belt Activity Tolerance: Patient limited by fatigue Patient left: in bed;with call bell/phone within reach;with bed alarm set            Time: [id number] PT Time Calculation (min): 18 min   Charges:   PT Evaluation $Initial PT Evaluation Tier I: 1 Procedure PT Treatments $Therapeutic Activity: 8-22 mins   PT G Codes:          Nathalia Wismer,KATHrine E 03/13/2014, 10:41 AM Zenovia JarredKati Cozette Braggs, PT, DPT 03/13/2014 Pager: [phone]

## 2014-03-13 NOTE — Progress Notes (Addendum)
Clinical Social Work  CSW spoke wit PT during progression meeting who reports that she will recommend SNF for patient. CSW spoke with ALF who reports patient was independent with ambulating when at the facility. Patient needed assistance with bathing and dressing and staff would give her utensils and show her where food was placed but she would feed herself. CSW went to room but patient sleeping and unable to fully participate in assessment due to confusion. CSW left a message with niece in order to discuss SNF placement. CSW will continue to follow.  Unk LightningHolly Murphy D[place], [place] [phone]  Addendum [id number] Niece returned CSW call and reports she is open to SNF placement. CSW emailed niece a SNF list so she can start reviewing. CSW will follow up with bed offers this afternoon.

## 2014-03-13 NOTE — Progress Notes (Signed)
PROGRESS NOTE  Catherine Howell RUE:[id number] DOB: 04/11/1931 DOA: 03/11/2014 PCP: Kaleen MaskELKINS,[name], MD  Assessment/Plan: HCAP  -Started empiric vancomycin and cefepime  -appreciate Speech therapy eval-->continue present soft diet  -Pulmonary hygiene  -IV fluids  Dysuria/UTI -Unclear reliability as the patient fluctuates in her complaints whether she is actually having dysuria  -Patient has nitrites in the urine but this is nonspecific  -Urine culture revealed ESBL Escherichia coli -Discontinue cefepime -03/13/14--Start imipenem Acute encephalopathy  -improving with fluid and abx  -PT eval-->recommend SNF Normocytic anemia  -Hemoglobin has dropped over 3 g since 12/21/2013  -Hemoccult stool  -However, it appears that the patient's baseline hemoglobin is approximately 12  -Serum B12--881, RBC folate 1101, iron saturation 27%  -Hemoglobin trending down partly due to dilution  Hypertension  -Hold all antihypertensive medications as the patient's blood pressure is soft  Dementia  -Continue Depakote and Risperdal  AKI  -Secondary to dehydration  -Baseline creatinine 0.8-0.9  -IV fluids-->improving  Diarrhea  -C. difficile PCR--neg  Family Communication: Niece updated  Disposition Plan: SNF 10/31 if stable         Procedures/Studies: Dg Chest Portable 1 View  03/11/2014   CLINICAL DATA:  Dementia, fever  EXAM: PORTABLE CHEST - 1 VIEW  COMPARISON:  12/21/2013  FINDINGS: There is elevation of the left diaphragm. There is bilateral mild interstitial thickening likely chronic. There is right lower lobe airspace disease concerning for pneumonia. There is no pleural effusion or pneumothorax. The heart and mediastinal contours are unremarkable.  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  IMPRESSION: Hazy right lower lobe airspace disease concerning for pneumonia. Recommend followup radiography in 4-6 weeks, to document complete resolution following  adequate medical therapy. If there is not complete resolution, then recommend further evaluation with CT of the chest to exclude underlying pathology.   Electronically Signed   By: Elige KoHetal  Patel   On: 03/11/2014 08:15         Subjective: Patient denies fevers, chills, headache, chest pain, dyspnea, nausea, vomiting, diarrhea, abdominal pain, dysuria, hematuria   Objective: Filed Vitals:   03/12/14 2131 03/13/14 0636 03/13/14 0855 03/13/14 1300  BP: 118/67 127/63 135/88 138/86  Pulse: 62 63 73 76  Temp: 98.7 F (37.1 C) 98.2 F (36.8 C) 97.3 F (36.3 C) 97.5 F (36.4 C)  TempSrc: Oral Oral Oral Oral  Resp: 16 16 17 16   Height:      Weight:      SpO2: 99% 98% 98% 99%    Intake/Output Summary (Last 24 hours) at 03/13/14 1913 Last data filed at 03/13/14 1748  Gross per 24 hour  Intake 2836.25 ml  Output      1 ml  Net 2835.25 ml   Weight change:  Exam:   General:  Pt is alert, follows commands appropriately, not in acute distress  HEENT: No icterus, No thrush,[place]/AT  Cardiovascular: RRR, S1/S2, no rubs, no gallops Respiratory:Poor inspiratory effort. Bibasilar rales, right greater than left    Abdomen: Soft/+BS, non tender, non distended, no guarding  Extremities: trace LE edema, No lymphangitis, No petechiae, No rashes, no synovitis  Data Reviewed: Basic Metabolic Panel:  Recent Labs Lab 03/11/14 0804 03/12/14 0430  NA 140 140  K 4.0 4.0  CL 106 108  CO2 21 22  GLUCOSE 93 83  BUN 23 20  CREATININE 1.16* 0.94  CALCIUM 8.5 8.3*   Liver Function Tests:  Recent Labs Lab 03/11/14 0804  AST  16  ALT 10  ALKPHOS 47  BILITOT 0.6  PROT 6.2  ALBUMIN 3.0*   No results found for this basename: LIPASE, AMYLASE,  in the last 168 hours No results found for this basename: AMMONIA,  in the last 168 hours CBC:  Recent Labs Lab 03/11/14 0804 03/12/14 0430  WBC 8.4 4.7  NEUTROABS 7.2  --   HGB 10.7* 10.0*  HCT 32.8* 30.3*  MCV 91.6 92.4  PLT 124*  105*   Cardiac Enzymes: No results found for this basename: CKTOTAL, CKMB, CKMBINDEX, TROPONINI,  in the last 168 hours BNP: No components found with this basename: POCBNP,  CBG:  Recent Labs Lab 03/12/14 0753 03/12/14 1131 03/12/14 1706  GLUCAP 81 103* 112*    Recent Results (from the past 240 hour(s))  URINE CULTURE     Status: None   Collection Time    03/11/14  7:48 AM      Result Value Ref Range Status   Specimen Description URINE, CATHETERIZED   Final   Special Requests NONE   Final   Culture  Setup Time     Final   Value: 03/11/2014 11:26     Performed at [facility] Count     Final   Value: >=100,000 COLONIES/ML     Performed at [facility]   Culture     Final   Value: ESCHERICHIA COLI     Note: Confirmed Extended Spectrum Beta-Lactamase Producer (ESBL)     Performed at [facility]   Report Status 03/13/2014 FINAL   Final   Organism ID, Bacteria ESCHERICHIA COLI   Final  MRSA PCR SCREENING     Status: Abnormal   Collection Time    03/11/14 12:25 PM      Result Value Ref Range Status   MRSA by PCR POSITIVE (*) NEGATIVE Final   Comment:            The GeneXpert MRSA Assay (FDA     approved for NASAL specimens     only), is one component of a     comprehensive MRSA colonization     surveillance program. It is not     intended to diagnose MRSA     infection nor to guide or     monitor treatment for     MRSA infections.     RESULT CALLED TO, READ BACK BY AND VERIFIED WITH:     Feliciana Rossetti RN 03/11/2014 1659 BY BOVELL,T.  CLOSTRIDIUM DIFFICILE BY PCR     Status: None   Collection Time    03/12/14 11:56 AM      Result Value Ref Range Status   C difficile by pcr NEGATIVE  NEGATIVE Final   Comment: Performed at [facility]     Scheduled Meds: . antiseptic oral rinse  7 mL Mouth Rinse q12n4p  . aspirin  81 mg Oral Q breakfast  . chlorhexidine  15 mL Mouth Rinse BID  . Chlorhexidine Gluconate Cloth  6 each  Topical Q0600  . cholecalciferol  1,000 Units Oral Daily  . divalproex  250 mg Oral BID  . enoxaparin (LOVENOX) injection  40 mg Subcutaneous Q24H  . feeding supplement (ENSURE)  1 Container Oral TID WC  . imipenem-cilastatin  250 mg Intravenous Q6H  . loratadine  10 mg Oral Daily  . loteprednol  1 drop Left Eye Daily  . mupirocin ointment  1 application Nasal BID  . polyvinyl alcohol  1  drop Left Eye BID  . potassium chloride SA  20 mEq Oral Daily  . risperiDONE  2 mg Oral BID  . vancomycin  750 mg Intravenous Q24H   Continuous Infusions: . sodium chloride 75 mL/hr at 03/12/14 1252     Tinia Oravec, DO  [facility] Pager [phone]  If 7PM-7AM, please contact night-coverage [email or web address] Password [email or web address] 03/13/2014, 7:13 PM   LOS: 2 days

## 2014-03-14 LAB — CBC
HCT: 33.2 % — ABNORMAL LOW (ref 36.0–46.0)
Hemoglobin: 11.2 g/dL — ABNORMAL LOW (ref 12.0–15.0)
MCH: 30.6 pg (ref 26.0–34.0)
MCHC: 33.7 g/dL (ref 30.0–36.0)
MCV: 90.7 fL (ref 78.0–100.0)
Platelets: 117 10*3/uL — ABNORMAL LOW (ref 150–400)
RBC: 3.66 MIL/uL — AB (ref 3.87–5.11)
RDW: 13 % (ref 11.5–15.5)
WBC: 4.5 10*3/uL (ref 4.0–10.5)

## 2014-03-14 LAB — BASIC METABOLIC PANEL
Anion gap: 10 (ref 5–15)
BUN: 9 mg/dL (ref 6–23)
CALCIUM: 8.8 mg/dL (ref 8.4–10.5)
CO2: 25 meq/L (ref 19–32)
Chloride: 107 mEq/L (ref 96–112)
Creatinine, Ser: 0.81 mg/dL (ref 0.50–1.10)
GFR calc Af Amer: 77 mL/min — ABNORMAL LOW (ref >90)
GFR, EST NON AFRICAN AMERICAN: 66 mL/min — AB (ref >90)
Glucose, Bld: 85 mg/dL (ref 70–99)
POTASSIUM: 4 meq/L (ref 3.7–5.3)
SODIUM: 142 meq/L (ref 137–147)

## 2014-03-14 MED ORDER — LEVOFLOXACIN 750 MG PO TABS: 750.0000 mg | ORAL_TABLET | ORAL | Status: DC

## 2014-03-14 MED ORDER — LEVOFLOXACIN 750 MG PO TABS
750.0000 mg | ORAL_TABLET | ORAL | Status: DC
  Administered 2014-03-14: 750 mg via ORAL
  Filled 2014-03-14: qty 1

## 2014-03-14 MED ORDER — SULFAMETHOXAZOLE-TMP DS 800-160 MG PO TABS
1.0000 | ORAL_TABLET | Freq: Two times a day (BID) | ORAL | Status: DC
Start: 2014-03-14 — End: 2014-03-14
  Administered 2014-03-14: 1 via ORAL
  Filled 2014-03-14 (×2): qty 1

## 2014-03-14 MED ORDER — ENSURE PUDDING PO PUDG: 1.0000 | Freq: Three times a day (TID) | ORAL | Status: DC

## 2014-03-14 MED ORDER — SULFAMETHOXAZOLE-TMP DS 800-160 MG PO TABS: 1.0000 | ORAL_TABLET | Freq: Two times a day (BID) | ORAL | Status: DC

## 2014-03-14 NOTE — Progress Notes (Signed)
Report called and given to Joyce GrossKay, nursing supervisor at [facility] after speaking with couple of nurses who were not taking care of patient. All questions answered to supervisor's satisfaction. Pt left unit after 1200 noon. Picked up by ambulance and left on stretcher pushed by ambulance personnel. Left in good condition. Vwilliams,rn.

## 2014-03-14 NOTE — Discharge Summary (Signed)
Physician Discharge Summary  Catherine Howell:[id number] DOB: [birth year] DOA: 03/11/2014  PCP: Kaleen Mask, MD  Admit date: 03/11/2014 Discharge date: 03/14/2014  Recommendations for Outpatient Follow-up:  1. Pt will need to follow up with PCP in 2 weeks post discharge 2. Please obtain BMP and CBC in one week 3. Please assist patient in feeding during meals   Discharge Diagnoses:  HCAP  -Started empiric vancomycin and cefepime  -appreciate Speech therapy eval-->continue present soft diet with thin liquids -Pulmonary hygiene  -IV fluids  -pt received 3 days IV abx -switched to po levofloxacin 750mg  every 48 hrs x 4 more days to complete 7 days of therapy Dysuria/UTI  -Unclear reliability as the patient fluctuates in her complaints whether she is actually having dysuria  -Patient has nitrites in the urine but this is nonspecific  -Urine culture revealed ESBL Escherichia coli  -Discontinue cefepime  -03/13/14--Start imipenem  -switched to po Bactrim DS for 6 additional days to complete 7 days of therapy Acute encephalopathy  -improving with fluid and abx  -PT eval-->recommend SNF  -back to baseline at time of d/c Normocytic anemia  -Hemoglobin has dropped over 3 g since 12/21/2013  -Hemoccult stool  -However, it appears that the patient's baseline hemoglobin is approximately 12  -Serum B12--881, RBC folate 1101, iron saturation 27%  -Hemoglobin trending down partly due to dilution--remained stable through the hospitalization Hypertension  -Hold all antihypertensive medications as the patient's blood pressure is soft  -will not restart amlodipine at time of d/c Dementia  -Continue Depakote and Risperdal  AKI  -Secondary to dehydration  -Baseline creatinine 0.8-0.9  -IV fluids-->improving  -serum creatinine 0.81 on day of d/c Diarrhea  -C. difficile PCR--neg  Family Communication: Niece updated    Discharge Condition: stable  Disposition:  Designer, multimedia with thin liquids Wt Readings from Last 3 Encounters:  03/11/14 53.706 kg (118 lb 6.4 oz)  12/12/13 32.9 kg (72 lb 8.5 oz)  10/03/12 58.968 kg (130 lb)    History of present illness:  80 year old female with a history of dementia, hypertension, COPD, depression presents from [facility] after the patient was found to have a temperature 103.631F. The patient is unable to provide any significant history due to her dementia. All of this history is obtained from review of the medical record and speaking with the emergency department physician. From the record, it also appeared that the patient was not acting at her baseline at her nursing facility. As result, the patient was brought to the emergency department. In the emergency department, the patient had a rectal temperature 100.31F. She was awake and conversant, albeit confused. The patient does complain of coughing and shortness of breath but denied any headache, chest pain, abdominal pain. She complained of some dysuria.  In the emergency department, the patient had a soft blood pressure of 98/62, oxygen saturation was 96% on room air. CBC revealed hemoglobin 10.7. Serum creatinine was 1.16. Hepatic enzymes were unremarkable. Urinalysis showed 3-6 WBC, positive nitrites. Lactic acid was 0.62. Chest x-ray showed right lower lobe opacity. The patient was started on empiric vancomycin and cefepime. She clinically improved. She remained stable on room air. Urine cultures grew ESBL Escherichia coli. The patient was switched from cefepime to imipenem. At the time of discharge, the patient was started on oral antibiotics. Specifically, the patient will be switched to levofloxacin for her pneumonia and Bactrim DS for her ESBL Escherichia coli UTI. Instructions are as noted above. The patient was  evaluated by speech therapy. They recommended continued soft diet with thin liquids with assistance during feedings. The patient should sit  upright during meals to minimize aspiration risk.   Discharge Exam: Filed Vitals:   03/14/14 0447  BP: 111/58  Pulse: 63  Temp: 98.5 F (36.9 C)  Resp: 16   Filed Vitals:   03/13/14 0855 03/13/14 1300 03/13/14 2214 03/14/14 0447  BP: 135/88 138/86 130/67 111/58  Pulse: 73 76 121 63  Temp: 97.3 F (36.3 C) 97.5 F (36.4 C) 98 F (36.7 C) 98.5 F (36.9 C)  TempSrc: Oral Oral Oral Oral  Resp: 17 16 16 16   Height:      Weight:      SpO2: 98% 99% 100% 97%   General: Alert and awake, NAD, pleasant, cooperative Cardiovascular: RRR, no rub, no gallop, no S3 Respiratory:   bibasilar rales, right greater than left. No wheezing Abdomen:soft, nontender, nondistended, positive bowel sounds Extremities: No edema, No lymphangitis, no petechiae  Discharge Instructions      Discharge Instructions   Increase activity slowly    Complete by:  As directed      Increase activity slowly    Complete by:  As directed             Medication List    STOP taking these medications       amLODipine 10 MG tablet  Commonly known as:  NORVASC     dimenhyDRINATE 50 MG tablet  Commonly known as:  DRAMAMINE      TAKE these medications       acetaminophen 500 MG tablet  Commonly known as:  TYLENOL  Take 500 mg by mouth 2 (two) times daily.     acetaminophen 325 MG tablet  Commonly known as:  TYLENOL  Take 650 mg by mouth every 6 (six) hours as needed for mild pain, fever or headache.     alum & mag hydroxide-simeth 200-200-20 MG/5ML suspension  Commonly known as:  MAALOX/MYLANTA  Take 30 mLs by mouth daily as needed for indigestion or heartburn.     aspirin 81 MG chewable tablet  Chew 81 mg by mouth daily with breakfast.     D3-1000 1000 UNITS tablet  Generic drug:  Cholecalciferol  Take 1,000 Units by mouth daily.     divalproex 250 MG DR tablet  Commonly known as:  DEPAKOTE  Take 250 mg by mouth 2 (two) times daily.     feeding supplement (ENSURE) Pudg  Take 1  Container by mouth 3 (three) times daily with meals.     guaifenesin 100 MG/5ML syrup  Commonly known as:  ROBITUSSIN  Take 200 mg by mouth every 6 (six) hours as needed for cough.     hydroxypropyl methylcellulose / hypromellose 2.5 % ophthalmic solution  Commonly known as:  ISOPTO TEARS / GONIOVISC  Place 1 drop into the left eye 2 (two) times daily.     lactobacillus acidophilus Tabs tablet  Take 1 tablet by mouth 3 (three) times daily.     levofloxacin 750 MG tablet  Commonly known as:  LEVAQUIN  Take 1 tablet (750 mg total) by mouth every other day.     loperamide 2 MG capsule  Commonly known as:  IMODIUM  Take by mouth as needed for diarrhea or loose stools.     loratadine 10 MG tablet  Commonly known as:  CLARITIN  Take 10 mg by mouth daily.     loteprednol 0.5 % ophthalmic suspension  Commonly  known as:  LOTEMAX  Place 1 drop into the left eye daily.     magnesium hydroxide 400 MG/5ML suspension  Commonly known as:  MILK OF MAGNESIA  Take 22.5 mLs by mouth daily as needed for mild constipation.     meclizine 25 MG tablet  Commonly known as:  ANTIVERT  Take 25 mg by mouth every 6 (six) hours as needed for dizziness.     neomycin-bacitracin-polymyxin ointment  Commonly known as:  NEOSPORIN  Apply 1 application topically as needed for wound care.     potassium chloride SA 20 MEQ tablet  Commonly known as:  K-DUR,KLOR-CON  Take 20 mEq by mouth daily.     PREPARATION H HYDROCORTISONE 1 %  Generic drug:  hydrocortisone cream  Apply 1 application topically 2 (two) times daily.     PROAIR HFA 108 (90 BASE) MCG/ACT inhaler  Generic drug:  albuterol  Inhale 2 puffs into the lungs every 4 (four) hours as needed for wheezing.     risperiDONE 2 MG disintegrating tablet  Commonly known as:  RISPERDAL M-TABS  Take 2 mg by mouth 2 (two) times daily.     sodium phosphate 7-19 GM/118ML Enem  Place 1 enema rectally daily as needed for mild constipation or severe  constipation.     sulfamethoxazole-trimethoprim 800-160 MG per tablet  Commonly known as:  BACTRIM DS  Take 1 tablet by mouth every 12 (twelve) hours.         The results of significant diagnostics from this hospitalization (including imaging, microbiology, ancillary and laboratory) are listed below for reference.    Significant Diagnostic Studies: Dg Chest Portable 1 View  03/11/2014   CLINICAL DATA:  Dementia, fever  EXAM: PORTABLE CHEST - 1 VIEW  COMPARISON:  12/21/2013  FINDINGS: There is elevation of the left diaphragm. There is bilateral mild interstitial thickening likely chronic. There is right lower lobe airspace disease concerning for pneumonia. There is no pleural effusion or pneumothorax. The heart and mediastinal contours are unremarkable.  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  IMPRESSION: Hazy right lower lobe airspace disease concerning for pneumonia. Recommend followup radiography in 4-6 weeks, to document complete resolution following adequate medical therapy. If there is not complete resolution, then recommend further evaluation with CT of the chest to exclude underlying pathology.   Electronically Signed   By: Elige KoHetal  Patel   On: 03/11/2014 08:15     Microbiology: Recent Results (from the past 240 hour(s))  URINE CULTURE     Status: None   Collection Time    03/11/14  7:48 AM      Result Value Ref Range Status   Specimen Description URINE, CATHETERIZED   Final   Special Requests NONE   Final   Culture  Setup Time     Final   Value: 03/11/2014 11:26     Performed at [facility]   Colony Count     Final   Value: >=100,000 COLONIES/ML     Performed at [facility]   Culture     Final   Value: ESCHERICHIA COLI     Note: Confirmed Extended Spectrum Beta-Lactamase Producer (ESBL)     Performed at [facility]   Report Status 03/13/2014 FINAL   Final   Organism ID, Bacteria ESCHERICHIA COLI   Final  MRSA PCR SCREENING      Status: Abnormal   Collection Time    03/11/14 12:25 PM      Result Value Ref  Range Status   MRSA by PCR POSITIVE (*) NEGATIVE Final   Comment:            The GeneXpert MRSA Assay (FDA     approved for NASAL specimens     only), is one component of a     comprehensive MRSA colonization     surveillance program. It is not     intended to diagnose MRSA     infection nor to guide or     monitor treatment for     MRSA infections.     RESULT CALLED TO, READ BACK BY AND VERIFIED WITH:     Feliciana Rossetti RN 03/11/2014 1659 BY BOVELL,T.  CLOSTRIDIUM DIFFICILE BY PCR     Status: None   Collection Time    03/12/14 11:56 AM      Result Value Ref Range Status   C difficile by pcr NEGATIVE  NEGATIVE Final   Comment: Performed at [facility]     Labs: Basic Metabolic Panel:  Recent Labs Lab 03/11/14 0804 03/12/14 0430 03/14/14 0528  NA 140 140 142  K 4.0 4.0 4.0  CL 106 108 107  CO2 21 22 25   GLUCOSE 93 83 85  BUN 23 20 9   CREATININE 1.16* 0.94 0.81  CALCIUM 8.5 8.3* 8.8   Liver Function Tests:  Recent Labs Lab 03/11/14 0804  AST 16  ALT 10  ALKPHOS 47  BILITOT 0.6  PROT 6.2  ALBUMIN 3.0*   No results found for this basename: LIPASE, AMYLASE,  in the last 168 hours No results found for this basename: AMMONIA,  in the last 168 hours CBC:  Recent Labs Lab 03/11/14 0804 03/12/14 0430 03/14/14 0528  WBC 8.4 4.7 4.5  NEUTROABS 7.2  --   --   HGB 10.7* 10.0* 11.2*  HCT 32.8* 30.3* 33.2*  MCV 91.6 92.4 90.7  PLT 124* 105* 117*   Cardiac Enzymes: No results found for this basename: CKTOTAL, CKMB, CKMBINDEX, TROPONINI,  in the last 168 hours BNP: No components found with this basename: POCBNP,  CBG:  Recent Labs Lab 03/12/14 0753 03/12/14 1131 03/12/14 1706  GLUCAP 81 103* 112*    Time coordinating discharge:  Greater than 30 minutes  Signed:  Shterna Laramee, DO [facility] Pager: [phone] 03/14/2014, 10:38 AM

## 2014-03-14 NOTE — Progress Notes (Signed)
   CSW prepared discharge packet which included FL2, H&P and hard copy prescriptions  CSW called Pt's niece to let her know that [facility] Pt not fully oriented so family is aware of discharge plans  CSW called non emergency ambulance for pt discharge  CSW signing off  Unk LightningHolly Vasilis Luhman LCSW

## 2014-05-17 ENCOUNTER — Encounter (HOSPITAL_COMMUNITY): Payer: Self-pay | Admitting: Emergency Medicine

## 2014-05-17 ENCOUNTER — Inpatient Hospital Stay (HOSPITAL_COMMUNITY)
Admission: EM | Admit: 2014-05-17 | Discharge: 2014-05-22 | DRG: 072 | Payer: Medicare Other | Attending: Internal Medicine | Admitting: Internal Medicine

## 2014-05-17 ENCOUNTER — Emergency Department (HOSPITAL_COMMUNITY): Payer: Medicare Other

## 2014-05-17 DIAGNOSIS — F0390 Unspecified dementia without behavioral disturbance: Secondary | ICD-10-CM | POA: Diagnosis present

## 2014-05-17 DIAGNOSIS — Z66 Do not resuscitate: Secondary | ICD-10-CM | POA: Diagnosis present

## 2014-05-17 DIAGNOSIS — E860 Dehydration: Secondary | ICD-10-CM | POA: Diagnosis present

## 2014-05-17 DIAGNOSIS — I10 Essential (primary) hypertension: Secondary | ICD-10-CM | POA: Diagnosis present

## 2014-05-17 DIAGNOSIS — Z7982 Long term (current) use of aspirin: Secondary | ICD-10-CM | POA: Diagnosis not present

## 2014-05-17 DIAGNOSIS — E559 Vitamin D deficiency, unspecified: Secondary | ICD-10-CM | POA: Diagnosis present

## 2014-05-17 DIAGNOSIS — R1310 Dysphagia, unspecified: Secondary | ICD-10-CM | POA: Diagnosis present

## 2014-05-17 DIAGNOSIS — F329 Major depressive disorder, single episode, unspecified: Secondary | ICD-10-CM | POA: Diagnosis present

## 2014-05-17 DIAGNOSIS — Z87891 Personal history of nicotine dependence: Secondary | ICD-10-CM | POA: Diagnosis not present

## 2014-05-17 DIAGNOSIS — I4581 Long QT syndrome: Secondary | ICD-10-CM | POA: Diagnosis present

## 2014-05-17 DIAGNOSIS — Z515 Encounter for palliative care: Secondary | ICD-10-CM | POA: Diagnosis not present

## 2014-05-17 DIAGNOSIS — E875 Hyperkalemia: Secondary | ICD-10-CM | POA: Diagnosis present

## 2014-05-17 DIAGNOSIS — H540 Blindness, both eyes: Secondary | ICD-10-CM | POA: Diagnosis present

## 2014-05-17 DIAGNOSIS — R197 Diarrhea, unspecified: Secondary | ICD-10-CM | POA: Diagnosis present

## 2014-05-17 DIAGNOSIS — Z9049 Acquired absence of other specified parts of digestive tract: Secondary | ICD-10-CM | POA: Diagnosis present

## 2014-05-17 DIAGNOSIS — K219 Gastro-esophageal reflux disease without esophagitis: Secondary | ICD-10-CM | POA: Diagnosis present

## 2014-05-17 DIAGNOSIS — Z8673 Personal history of transient ischemic attack (TIA), and cerebral infarction without residual deficits: Secondary | ICD-10-CM

## 2014-05-17 DIAGNOSIS — H409 Unspecified glaucoma: Secondary | ICD-10-CM | POA: Diagnosis present

## 2014-05-17 DIAGNOSIS — R4182 Altered mental status, unspecified: Secondary | ICD-10-CM

## 2014-05-17 DIAGNOSIS — K529 Noninfective gastroenteritis and colitis, unspecified: Secondary | ICD-10-CM

## 2014-05-17 DIAGNOSIS — J449 Chronic obstructive pulmonary disease, unspecified: Secondary | ICD-10-CM | POA: Diagnosis present

## 2014-05-17 DIAGNOSIS — G9341 Metabolic encephalopathy: Secondary | ICD-10-CM | POA: Diagnosis present

## 2014-05-17 DIAGNOSIS — H547 Unspecified visual loss: Secondary | ICD-10-CM | POA: Diagnosis present

## 2014-05-17 DIAGNOSIS — G319 Degenerative disease of nervous system, unspecified: Secondary | ICD-10-CM | POA: Diagnosis present

## 2014-05-17 LAB — BASIC METABOLIC PANEL
Anion gap: 10 (ref 5–15)
BUN: 19 mg/dL (ref 6–23)
CO2: 23 mmol/L (ref 19–32)
Calcium: 10 mg/dL (ref 8.4–10.5)
Chloride: 105 mEq/L (ref 96–112)
Creatinine, Ser: 0.87 mg/dL (ref 0.50–1.10)
GFR calc Af Amer: 70 mL/min — ABNORMAL LOW (ref >90)
GFR, EST NON AFRICAN AMERICAN: 61 mL/min — AB (ref >90)
Glucose, Bld: 160 mg/dL — ABNORMAL HIGH (ref 70–99)
POTASSIUM: 3.6 mmol/L (ref 3.5–5.1)
SODIUM: 138 mmol/L (ref 135–145)

## 2014-05-17 LAB — CBC
HCT: 43.7 % (ref 36.0–46.0)
Hemoglobin: 15.4 g/dL — ABNORMAL HIGH (ref 12.0–15.0)
MCH: 30.7 pg (ref 26.0–34.0)
MCHC: 35.2 g/dL (ref 30.0–36.0)
MCV: 87.1 fL (ref 78.0–100.0)
Platelets: 209 10*3/uL (ref 150–400)
RBC: 5.02 MIL/uL (ref 3.87–5.11)
RDW: 13.6 % (ref 11.5–15.5)
WBC: 8.7 10*3/uL (ref 4.0–10.5)

## 2014-05-17 LAB — URINE MICROSCOPIC-ADD ON

## 2014-05-17 LAB — URINALYSIS, ROUTINE W REFLEX MICROSCOPIC
Glucose, UA: NEGATIVE mg/dL
Hgb urine dipstick: NEGATIVE
KETONES UR: 40 mg/dL — AB
NITRITE: NEGATIVE
PH: 5.5 (ref 5.0–8.0)
Protein, ur: 30 mg/dL — AB
Specific Gravity, Urine: 1.027 (ref 1.005–1.030)
Urobilinogen, UA: 1 mg/dL (ref 0.0–1.0)

## 2014-05-17 LAB — I-STAT CG4 LACTIC ACID, ED: LACTIC ACID, VENOUS: 1.57 mmol/L (ref 0.5–2.2)

## 2014-05-17 LAB — MAGNESIUM: Magnesium: 1.8 mg/dL (ref 1.5–2.5)

## 2014-05-17 MED ORDER — ONDANSETRON HCL 4 MG/2ML IJ SOLN: 4.0000 mg | Freq: Four times a day (QID) | INTRAMUSCULAR | Status: DC | PRN

## 2014-05-17 MED ORDER — ALUM & MAG HYDROXIDE-SIMETH 200-200-20 MG/5ML PO SUSP: 30.0000 mL | Freq: Four times a day (QID) | ORAL | Status: DC | PRN

## 2014-05-17 MED ORDER — ONDANSETRON HCL 4 MG PO TABS: 4.0000 mg | ORAL_TABLET | Freq: Four times a day (QID) | ORAL | Status: DC | PRN

## 2014-05-17 MED ORDER — ENOXAPARIN SODIUM 40 MG/0.4ML ~~LOC~~ SOLN
40.0000 mg | SUBCUTANEOUS | Status: DC
  Administered 2014-05-17 – 2014-05-20 (×4): 40 mg via SUBCUTANEOUS
  Filled 2014-05-17 (×6): qty 0.4

## 2014-05-17 MED ORDER — DEXTROSE-NACL 5-0.9 % IV SOLN
INTRAVENOUS | Status: DC
  Administered 2014-05-17 – 2014-05-19 (×3): via INTRAVENOUS

## 2014-05-17 NOTE — ED Notes (Signed)
Patient returned from X-ray 

## 2014-05-17 NOTE — ED Notes (Addendum)
I Stat Lactic Acid results shown to Dr. B. Walden 

## 2014-05-17 NOTE — H&P (Signed)
[facility] History and Physical  Catherine Howell ZOX:[id number] DOB: [birth year] DOA: 05/17/2014   PCP: Kaleen Mask, MD    Chief Complaint: hyperkalemia  HPI: Catherine Howell is a 81 y.o. female who is blind with HTN, dementia, COPD and GERD sent from the nursing home for a K of 7.5. However, when rechecked here, it is normal. She is not arousable and apparently has been having severe diarrhea at the nursing home. She was noted to have a large watery BM in the ER. Per history in chart, she received a bag of fluids yesterday. At her baseline, she is usually alert and is able to answer questions.    ROS- cannot obtain due to lethargy  Past Medical History  Diagnosis Date  . Blind   . Condyloma acuminatum   . 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 Hypertension   . Glaucoma   . Memory deficits 10/03/2012  . 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 Hearing deficit   . Vitamin D deficiency   . Gastroesophageal reflux disease   . COPD (chronic obstructive pulmonary disease)   . Depression     Past Surgical History  Procedure Laterality Date  . Cholecystectomy    . Eye surgery      Social History: cannot obtain Lives at a nursing home    No Known Allergies  Family History  Problem Relation Age of Onset  . Adopted: Yes  cannot obtain   Prior to Admission medications   Medication Sig Start Date End Date Taking? Authorizing Provider  acetaminophen (TYLENOL) 500 MG tablet Take 500 mg by mouth 2 (two) times daily.   Yes Historical Provider, MD  aspirin 81 MG chewable tablet Chew 81 mg by mouth daily with breakfast.   Yes Historical Provider, MD  Cholecalciferol (D3-1000) 1000 UNITS tablet Take 1,000 Units by mouth daily.   Yes Historical Provider, MD  Dextrose-Sodium Chloride (DEXTROSE 5 % AND 0.45% NACL) infusion Inject 1,000 mLs into the vein once.   Yes Historical Provider, MD  divalproex (DEPAKOTE SPRINKLE) 125 MG capsule Take  125 mg by mouth at bedtime.   Yes Historical Provider, MD  Hypromellose (ISOPTO TEARS) 0.5 % SOLN Apply 1 drop to eye 2 (two) times daily.   Yes Historical Provider, MD  lactobacillus acidophilus (BACID) TABS tablet Take 1 tablet by mouth 3 (three) times daily.   Yes Historical Provider, MD  loratadine (CLARITIN) 10 MG tablet Take 10 mg by mouth daily.   Yes Historical Provider, MD  loteprednol (LOTEMAX) 0.5 % ophthalmic suspension Place 1 drop into the left eye daily.   Yes Historical Provider, MD  phenylephrine-shark liver oil-mineral oil-petrolatum (PREPARATION H) 0.25-3-14-71.9 % rectal ointment Place 1 application rectally 2 (two) times daily as needed for hemorrhoids.   Yes Historical Provider, MD  potassium chloride SA (K-DUR,KLOR-CON) 20 MEQ tablet Take 20 mEq by mouth daily.   Yes Historical Provider, MD  PRESCRIPTION MEDICATION Take 120 mLs by mouth 3 (three) times daily. Med plus   Yes Historical Provider, MD  acetaminophen (TYLENOL) 325 MG tablet Take 650 mg by mouth every 6 (six) hours as needed for mild pain, fever or headache.     Historical Provider, MD  albuterol (PROAIR HFA) 108 (90 BASE) MCG/ACT inhaler Inhale 2 puffs into the lungs every 4 (four) hours as needed for wheezing.    Historical Provider, MD  alum & mag hydroxide-simeth (MAALOX/MYLANTA) 200-200-20 MG/5ML suspension Take 30 mLs by mouth  daily as needed for indigestion or heartburn.    Historical Provider, MD  feeding supplement, ENSURE, (ENSURE) PUDG Take 1 Container by mouth 3 (three) times daily with meals. Patient not taking: Reported on 05/17/2014 03/14/14   Catarina Hartshorn, MD  guaifenesin (ROBITUSSIN) 100 MG/5ML syrup Take 200 mg by mouth every 6 (six) hours as needed for cough.    Historical Provider, MD  levofloxacin (LEVAQUIN) 750 MG tablet Take 1 tablet (750 mg total) by mouth every other day. Patient not taking: Reported on 05/17/2014 03/14/14   Catarina Hartshorn, MD  loperamide (IMODIUM) 2 MG capsule Take 2 mg by mouth as  needed for diarrhea or loose stools.     Historical Provider, MD  magnesium hydroxide (MILK OF MAGNESIA) 400 MG/5ML suspension Take 22.5 mLs by mouth daily as needed for mild constipation.     Historical Provider, MD  meclizine (ANTIVERT) 25 MG tablet Take 25 mg by mouth every 6 (six) hours as needed for dizziness.    Historical Provider, MD  neomycin-bacitracin-polymyxin (NEOSPORIN) ointment Apply 1 application topically as needed for wound care.    Historical Provider, MD  sodium phosphate (FLEET) 7-19 GM/118ML ENEM Place 1 enema rectally daily as needed for mild constipation or severe constipation.    Historical Provider, MD  sulfamethoxazole-trimethoprim (BACTRIM DS) 800-160 MG per tablet Take 1 tablet by mouth every 12 (twelve) hours. Patient not taking: Reported on 05/17/2014 03/14/14   Catarina Hartshorn, MD     Physical Exam: Filed Vitals:   05/17/14 1400 05/17/14 1447 05/17/14 1500 05/17/14 1600  BP: 114/77 108/72 101/70 107/72  Pulse: 82 76 78 73  Temp:      TempSrc:      Resp: 13 16 12 13   SpO2: 97% 99% 99% 100%     General: lethargic but grimaces when abdomen palpated HEENT: Normocephalic and Atraumatic, Mucous membranes pink                PERRLA; EOM intact; No scleral icterus,                 Nares: Patent, Oropharynx: Clear, Fair Dentition                 Neck: FROM, no cervical lymphadenopathy, thyromegaly, carotid bruit or JVD;  Breasts: deferred CHEST WALL: No tenderness  CHEST: Normal respiration, clear to auscultation bilaterally  HEART: Regular rate and rhythm; no murmurs rubs or gallops  BACK: No kyphosis or scoliosis; no CVA tenderness  ABDOMEN: Positive Bowel Sounds, soft,  Possibly tender as she grimaces when I palpate her abdomen; no masses, no organomegaly Rectal Exam: deferred EXTREMITIES: No cyanosis, clubbing, or edema Genitalia: not examined  SKIN:  no rash or ulceration  CNS: unable to properly assess  Labs on Admission:  Basic Metabolic Panel:  Recent  Labs Lab 05/17/14 1318  NA 138  K 3.6  CL 105  CO2 23  GLUCOSE 160*  BUN 19  CREATININE 0.87  CALCIUM 10.0  MG 1.8   Liver Function Tests: No results for input(s): AST, ALT, ALKPHOS, BILITOT, PROT, ALBUMIN in the last 168 hours. No results for input(s): LIPASE, AMYLASE in the last 168 hours. No results for input(s): AMMONIA in the last 168 hours. CBC:  Recent Labs Lab 05/17/14 1318  WBC 8.7  HGB 15.4*  HCT 43.7  MCV 87.1  PLT 209   Cardiac Enzymes: No results for input(s): CKTOTAL, CKMB, CKMBINDEX, TROPONINI in the last 168 hours.  BNP (last 3 results) No results for  input(s): PROBNP in the last 8760 hours. CBG: No results for input(s): GLUCAP in the last 168 hours.  Radiological Exams on Admission: Dg Chest 2 View  05/17/2014   CLINICAL DATA:  Initial encounter for altered mental status  EXAM: CHEST  2 VIEW  COMPARISON:  None.  FINDINGS: Stable asymmetric elevation of the left hemidiaphragm. Airspace disease seen at the right base on the previous study has resolved although the extreme right costophrenic angle is not been included on this study. The lungs are clear without focal infiltrate, edema, pneumothorax or pleural effusion. Cardiopericardial silhouette is at upper limits of normal for size. Imaged bony structures of the thorax are intact.  IMPRESSION: No active cardiopulmonary disease.   Electronically Signed   By: Kennith Center M.D.   On: 05/17/2014 14:45   Ct Head Wo Contrast  05/17/2014   CLINICAL DATA:  Acute mental status changes which began earlier today with somnolence and unresponsiveness.  EXAM: CT HEAD WITHOUT CONTRAST  TECHNIQUE: Contiguous axial images were obtained from the base of the skull through the vertex without intravenous contrast.  COMPARISON:  12/21/2013 dating back to 04/11/2009.  FINDINGS: Severe cortical atrophy, moderate deep atrophy, and severe cerebellar atrophy, progressive since 2010. Ventricular system normal in size and appearance for  age. Moderate to severe changes of small vessel disease of the white matter diffusely, progressive since 2010. Old lacunar type strokes in the basal ganglia bilaterally, unchanged. No mass lesion. No midline shift. No acute hemorrhage or hematoma. No extra-axial fluid collections. No evidence of acute infarction. Partial empty sella.  Hyperostosis frontalis interna. No significant osseous abnormality involving the skull. Mucous retention cysts or polyps in the maxillary sinuses. Remaining visualized paranasal sinuses, bilateral mastoid air cells and bilateral middle ear cavities well aerated. Severe bilateral carotid siphon and vertebrobasilar atherosclerosis.  IMPRESSION: 1. No acute intracranial abnormality. 2. Severe generalized atrophy and moderate to severe chronic microvascular ischemic changes of the white matter diffusely, progressive since 2010. 3. Old lacunar type strokes in the basal ganglia bilaterally.   Electronically Signed   By: Hulan Saas M.D.   On: 05/17/2014 15:37    EKG: Independently reviewed. Sinus tachycardia, PVCs, first degree HB and prolonged Qt of 520 ms  Assessment/Plan Principal Problem:   Encephalopathy, metabolic - hold all oral meds while lethargic - likely secondary to infection/ dehydration  Active Problems:   Dehydration - start IVF     Diarrhea in adult patient - send stool pathogen panel    Blindness  Dementia  Prolonged QT interval - hold off on QT prolonging meds   Consulted: none  Code Status: Full code Family Communication:   DVT Prophylaxis:Lovenox  Time spent: 45 min  Teila Skalsky, MD [facility]  If 7PM-7AM, please contact night-coverage [email or web address] 05/17/2014, 4:24 PM

## 2014-05-17 NOTE — ED Provider Notes (Signed)
CSN: [id number]     Arrival date & time 05/17/14  1307 History   First MD Initiated Contact with Patient 05/17/14 1317     Chief Complaint  Patient presents with  . Abnormal Lab  . Diarrhea     (Consider location/radiation/quality/duration/timing/severity/associated sxs/prior Treatment) Patient is a 81 y.o. female presenting with diarrhea. The history is provided by the nursing home and the EMS personnel. The history is limited by the condition of the patient.  Diarrhea Quality:  Watery Severity:  Severe Onset quality:  Gradual Timing:  Constant Progression:  Unchanged Relieved by:  Nothing Worsened by:  Nothing tried   Past Medical History  Diagnosis Date  . Blind   . Condyloma acuminatum   . 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 Hypertension   . Glaucoma   . Memory deficits 10/03/2012  . 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 Hearing deficit   . Vitamin D deficiency   . Gastroesophageal reflux disease   . COPD (chronic obstructive pulmonary disease)   . Depression    Past Surgical History  Procedure Laterality Date  . Cholecystectomy    . Eye surgery     Family History  Problem Relation Age of Onset  . Adopted: Yes   History  Substance Use Topics  . Smoking status: Former Games developer  . Smokeless tobacco: Never Used  . Alcohol Use: No   OB History    No data available     Review of Systems  Unable to perform ROS: Patient nonverbal  Gastrointestinal: Positive for diarrhea.      Allergies  Review of patient's allergies indicates no known allergies.  Home Medications   Prior to Admission medications   Medication Sig Start Date End Date Taking? Authorizing Provider  acetaminophen (TYLENOL) 325 MG tablet Take 650 mg by mouth every 6 (six) hours as needed for mild pain, fever or headache.     Historical Provider, MD  acetaminophen (TYLENOL) 500 MG tablet Take 500 mg by mouth 2 (two) times daily.    Historical Provider, MD   albuterol (PROAIR HFA) 108 (90 BASE) MCG/ACT inhaler Inhale 2 puffs into the lungs every 4 (four) hours as needed for wheezing.    Historical Provider, MD  alum & mag hydroxide-simeth (MAALOX/MYLANTA) 200-200-20 MG/5ML suspension Take 30 mLs by mouth daily as needed for indigestion or heartburn.    Historical Provider, MD  aspirin 81 MG chewable tablet Chew 81 mg by mouth daily with breakfast.    Historical Provider, MD  Cholecalciferol (D3-1000) 1000 UNITS tablet Take 1,000 Units by mouth daily.    Historical Provider, MD  divalproex (DEPAKOTE) 250 MG DR tablet Take 250 mg by mouth 2 (two) times daily.    Historical Provider, MD  feeding supplement, ENSURE, (ENSURE) PUDG Take 1 Container by mouth 3 (three) times daily with meals. 03/14/14   Catarina Hartshorn, MD  guaifenesin (ROBITUSSIN) 100 MG/5ML syrup Take 200 mg by mouth every 6 (six) hours as needed for cough.    Historical Provider, MD  hydrocortisone cream (PREPARATION H HYDROCORTISONE) 1 % Apply 1 application topically 2 (two) times daily.    Historical Provider, MD  hydroxypropyl methylcellulose / hypromellose (ISOPTO TEARS / GONIOVISC) 2.5 % ophthalmic solution Place 1 drop into the left eye 2 (two) times daily.    Historical Provider, MD  lactobacillus acidophilus (BACID) TABS tablet Take 1 tablet by mouth 3 (three) times daily.    Historical Provider, MD  levofloxacin (  LEVAQUIN) 750 MG tablet Take 1 tablet (750 mg total) by mouth every other day. 03/14/14   Catarina Hartshorn, MD  loperamide (IMODIUM) 2 MG capsule Take by mouth as needed for diarrhea or loose stools.    Historical Provider, MD  loratadine (CLARITIN) 10 MG tablet Take 10 mg by mouth daily.    Historical Provider, MD  loteprednol (LOTEMAX) 0.5 % ophthalmic suspension Place 1 drop into the left eye daily.    Historical Provider, MD  magnesium hydroxide (MILK OF MAGNESIA) 400 MG/5ML suspension Take 22.5 mLs by mouth daily as needed for mild constipation.     Historical Provider, MD   meclizine (ANTIVERT) 25 MG tablet Take 25 mg by mouth every 6 (six) hours as needed for dizziness.    Historical Provider, MD  neomycin-bacitracin-polymyxin (NEOSPORIN) ointment Apply 1 application topically as needed for wound care.    Historical Provider, MD  potassium chloride SA (K-DUR,KLOR-CON) 20 MEQ tablet Take 20 mEq by mouth daily.    Historical Provider, MD  risperiDONE (RISPERDAL M-TABS) 2 MG disintegrating tablet Take 2 mg by mouth 2 (two) times daily.    Historical Provider, MD  sodium phosphate (FLEET) 7-19 GM/118ML ENEM Place 1 enema rectally daily as needed for mild constipation or severe constipation.    Historical Provider, MD  sulfamethoxazole-trimethoprim (BACTRIM DS) 800-160 MG per tablet Take 1 tablet by mouth every 12 (twelve) hours. 03/14/14   Catarina Hartshorn, MD   SpO2 99% Physical Exam  Constitutional: She appears well-developed and well-nourished. No distress.  Cachectic  HENT:  Head: Normocephalic and atraumatic.  Mouth/Throat: Oropharynx is clear and moist. Mucous membranes are dry.  Eyes: EOM are normal. Pupils are equal, round, and reactive to light.  L eye with corneal white-out.  Neck: Normal range of motion. Neck supple.  Cardiovascular: Normal rate and regular rhythm.  Exam reveals no friction rub.   No murmur heard. Pulmonary/Chest: Effort normal and breath sounds normal. No respiratory distress. She has no wheezes. She has no rales.  Abdominal: Soft. She exhibits no distension. There is no tenderness. There is no rebound.  Musculoskeletal: Normal range of motion. She exhibits no edema.  Neurological:  Nonverbal  Skin: No rash noted. She is not diaphoretic.  Nursing note and vitals reviewed.   ED Course  Procedures (including critical care time) Labs Review Labs Reviewed  CBC  BASIC METABOLIC PANEL    Imaging Review Dg Chest 2 View  05/17/2014   CLINICAL DATA:  Initial encounter for altered mental status  EXAM: CHEST  2 VIEW  COMPARISON:  None.   FINDINGS: Stable asymmetric elevation of the left hemidiaphragm. Airspace disease seen at the right base on the previous study has resolved although the extreme right costophrenic angle is not been included on this study. The lungs are clear without focal infiltrate, edema, pneumothorax or pleural effusion. Cardiopericardial silhouette is at upper limits of normal for size. Imaged bony structures of the thorax are intact.  IMPRESSION: No active cardiopulmonary disease.   Electronically Signed   By: Kennith Center M.D.   On: 05/17/2014 14:45   Ct Head Wo Contrast  05/17/2014   CLINICAL DATA:  Acute mental status changes which began earlier today with somnolence and unresponsiveness.  EXAM: CT HEAD WITHOUT CONTRAST  TECHNIQUE: Contiguous axial images were obtained from the base of the skull through the vertex without intravenous contrast.  COMPARISON:  12/21/2013 dating back to 04/11/2009.  FINDINGS: Severe cortical atrophy, moderate deep atrophy, and severe cerebellar atrophy, progressive since  2010. Ventricular system normal in size and appearance for age. Moderate to severe changes of small vessel disease of the white matter diffusely, progressive since 2010. Old lacunar type strokes in the basal ganglia bilaterally, unchanged. No mass lesion. No midline shift. No acute hemorrhage or hematoma. No extra-axial fluid collections. No evidence of acute infarction. Partial empty sella.  Hyperostosis frontalis interna. No significant osseous abnormality involving the skull. Mucous retention cysts or polyps in the maxillary sinuses. Remaining visualized paranasal sinuses, bilateral mastoid air cells and bilateral middle ear cavities well aerated. Severe bilateral carotid siphon and vertebrobasilar atherosclerosis.  IMPRESSION: 1. No acute intracranial abnormality. 2. Severe generalized atrophy and moderate to severe chronic microvascular ischemic changes of the white matter diffusely, progressive since 2010. 3. Old  lacunar type strokes in the basal ganglia bilaterally.   Electronically Signed   By: Hulan Saas M.D.   On: 05/17/2014 15:37     EKG Interpretation   Date/Time:  Sunday May 17 2014 13:45:12 EST Ventricular Rate:  104 PR Interval:  211 QRS Duration: 85 QT Interval:  395 QTC Calculation: 520 R Axis:   100 Text Interpretation:  Sinus tachycardia Multiple ventricular premature  complexes Borderline prolonged PR interval Low voltage, extremity and  precordial leads Consider RVH w/ secondary repol abnormality Repol abnrm,  severe global ischemia (LM/MVD) Prolonged QT interval Baseline wander in  lead(s) V3 Similar to prior Confirmed by Gwendolyn Grant  MD, Aaric Dolph ([id number]) on  05/17/2014 1:49:50 PM      MDM   Final diagnoses:  Diarrhea  Altered mental status    81 year old female presents with diarrhea and concern for possible hyperkalemia. History of blindness, dementia, hypertension. Last in the hospital a little over 2 months ago for UTI and HCAP. Patient here with diarrhea, dehydrated. Potassium was checked at some point yesterday and found to be 7.5. No noticed this was hemolyzed. Patient is nonverbal, she appears dehydrated. Will do sepsis workup. Labs normal. Still nonverbal, not following commands, which is different from baseline. Dr. Butler Denmark admitting.  Elwin Mocha, MD 05/17/14 [phone]

## 2014-05-17 NOTE — ED Notes (Signed)
Sent from [facility]; Several days of diarrhea; given a bag of fluids yesterday and basic labs drawn yesterday. Report came back today with K 7.5. Less responsive than usual per [facility] staff. Usually will answer some questions but today just sleeping.

## 2014-05-17 NOTE — ED Notes (Signed)
Patient transported to X-ray 

## 2014-05-17 NOTE — ED Notes (Signed)
Cleaned of large diarrhea BM; Yellow/brown in color. No odor. Before finished cleaning, another loose stool occurred. Cleaned again.

## 2014-05-18 DIAGNOSIS — R197 Diarrhea, unspecified: Secondary | ICD-10-CM

## 2014-05-18 LAB — BASIC METABOLIC PANEL
Anion gap: 8 (ref 5–15)
BUN: 15 mg/dL (ref 6–23)
CO2: 22 mmol/L (ref 19–32)
Calcium: 9.5 mg/dL (ref 8.4–10.5)
Chloride: 111 mEq/L (ref 96–112)
Creatinine, Ser: 0.74 mg/dL (ref 0.50–1.10)
GFR calc Af Amer: >90 mL/min (ref >90)
GFR calc non Af Amer: 78 mL/min — ABNORMAL LOW (ref >90)
GLUCOSE: 130 mg/dL — AB (ref 70–99)
Potassium: 3.9 mmol/L (ref 3.5–5.1)
SODIUM: 141 mmol/L (ref 135–145)

## 2014-05-18 LAB — CBC
HCT: 38.8 % (ref 36.0–46.0)
Hemoglobin: 13.8 g/dL (ref 12.0–15.0)
MCH: 30.7 pg (ref 26.0–34.0)
MCHC: 35.6 g/dL (ref 30.0–36.0)
MCV: 86.2 fL (ref 78.0–100.0)
Platelets: 176 10*3/uL (ref 150–400)
RBC: 4.5 MIL/uL (ref 3.87–5.11)
RDW: 13.6 % (ref 11.5–15.5)
WBC: 5.4 10*3/uL (ref 4.0–10.5)

## 2014-05-18 LAB — GI PATHOGEN PANEL BY PCR, STOOL
C difficile toxin A/B: NEGATIVE
Campylobacter by PCR: NEGATIVE
Cryptosporidium by PCR: NEGATIVE
E COLI (STEC): NEGATIVE
E COLI 0157 BY PCR: NEGATIVE
E coli (ETEC) LT/ST: NEGATIVE
G lamblia by PCR: NEGATIVE
Norovirus GI/GII: NEGATIVE
Rotavirus A by PCR: NEGATIVE
SALMONELLA BY PCR: NEGATIVE
SHIGELLA BY PCR: NEGATIVE

## 2014-05-18 LAB — URINE CULTURE
COLONY COUNT: NO GROWTH
Culture: NO GROWTH
Special Requests: NORMAL

## 2014-05-18 LAB — MRSA PCR SCREENING: MRSA BY PCR: NEGATIVE

## 2014-05-18 MED ORDER — LORATADINE 10 MG PO TABS
10.0000 mg | ORAL_TABLET | Freq: Every day | ORAL | Status: DC
  Administered 2014-05-18: 10 mg via ORAL
  Filled 2014-05-18 (×5): qty 1

## 2014-05-18 MED ORDER — BACID PO TABS
1.0000 | ORAL_TABLET | Freq: Three times a day (TID) | ORAL | Status: DC
  Administered 2014-05-18: 1 via ORAL
  Filled 2014-05-18 (×4): qty 1

## 2014-05-18 MED ORDER — GUAIFENESIN 100 MG/5ML PO SYRP
200.0000 mg | ORAL_SOLUTION | Freq: Four times a day (QID) | ORAL | Status: DC | PRN
  Filled 2014-05-18: qty 10

## 2014-05-18 MED ORDER — PHENYLEPH-SHARK LIV OIL-MO-PET 0.25-3-14-71.9 % RE OINT
1.0000 "application " | TOPICAL_OINTMENT | Freq: Two times a day (BID) | RECTAL | Status: DC | PRN
  Filled 2014-05-18: qty 28.4

## 2014-05-18 MED ORDER — ASPIRIN 81 MG PO CHEW
81.0000 mg | CHEWABLE_TABLET | Freq: Every day | ORAL | Status: DC
  Filled 2014-05-18 (×4): qty 1

## 2014-05-18 MED ORDER — LOTEPREDNOL ETABONATE 0.5 % OP SUSP
1.0000 [drp] | Freq: Every day | OPHTHALMIC | Status: DC
  Administered 2014-05-18 – 2014-05-19 (×2): 1 [drp] via OPHTHALMIC
  Filled 2014-05-18: qty 5

## 2014-05-18 MED ORDER — ENSURE PUDDING PO PUDG
1.0000 | Freq: Three times a day (TID) | ORAL | Status: DC
  Administered 2014-05-18: 1 via ORAL

## 2014-05-18 MED ORDER — WITCH HAZEL-GLYCERIN EX PADS
MEDICATED_PAD | Freq: Two times a day (BID) | CUTANEOUS | Status: DC | PRN
  Filled 2014-05-18: qty 100

## 2014-05-18 MED ORDER — ALBUTEROL SULFATE (2.5 MG/3ML) 0.083% IN NEBU: 2.0000 mL | INHALATION_SOLUTION | RESPIRATORY_TRACT | Status: DC | PRN

## 2014-05-18 MED ORDER — POTASSIUM CHLORIDE CRYS ER 20 MEQ PO TBCR
20.0000 meq | EXTENDED_RELEASE_TABLET | Freq: Every day | ORAL | Status: DC
  Administered 2014-05-18: 20 meq via ORAL
  Filled 2014-05-18 (×6): qty 1

## 2014-05-18 MED ORDER — HYPROMELLOSE 0.5 % OP SOLN: 1.0000 [drp] | Freq: Two times a day (BID) | OPHTHALMIC | Status: DC

## 2014-05-18 MED ORDER — POLYVINYL ALCOHOL 1.4 % OP SOLN
1.0000 [drp] | OPHTHALMIC | Status: DC | PRN
  Filled 2014-05-18: qty 15

## 2014-05-18 MED ORDER — DIVALPROEX SODIUM 125 MG PO CPSP
125.0000 mg | ORAL_CAPSULE | Freq: Every day | ORAL | Status: DC
  Filled 2014-05-18 (×5): qty 1

## 2014-05-18 MED ORDER — RISAQUAD PO CAPS
1.0000 | ORAL_CAPSULE | Freq: Three times a day (TID) | ORAL | Status: DC
  Filled 2014-05-18 (×4): qty 1

## 2014-05-18 MED ORDER — RISAQUAD PO CAPS
1.0000 | ORAL_CAPSULE | Freq: Three times a day (TID) | ORAL | Status: DC
  Administered 2014-05-18: 1 via ORAL
  Filled 2014-05-18 (×14): qty 1

## 2014-05-18 MED ORDER — CHOLECALCIFEROL 25 MCG (1000 UT) PO TABS
1000.0000 [IU] | ORAL_TABLET | Freq: Every day | ORAL | Status: DC
  Administered 2014-05-18: 1000 [IU] via ORAL
  Filled 2014-05-18 (×5): qty 1

## 2014-05-18 NOTE — Progress Notes (Signed)
Subjective: Patient nonverbal.  Per staff has not had any diarrhea.  Objective: Vital signs in last 24 hours: Filed Vitals:   05/17/14 1818 05/17/14 2047 05/18/14 0523 05/18/14 1314  BP: 104/65 93/67 128/79 134/79  Pulse: 75 79 68 73  Temp: 97.6 F (36.4 C) 97.7 F (36.5 C) 97.9 F (36.6 C) 97.5 F (36.4 C)  TempSrc: Axillary Oral Oral Axillary  Resp: SpO2: 98% 96% 96% 95%   Weight change:  No intake or output data in the 24 hours ending 05/18/14 1500  Physical Exam: General: Awake, No acute distress.  Nonverbal, responds to stimuli. HEENT: EOMI. Neck: Supple CV: S1 and S2 Lungs: Clear to ascultation bilaterally Abdomen: Soft, Nontender, Nondistended, +bowel sounds. Ext: Good pulses. Trace edema.  Lab Results: Basic Metabolic Panel:  Recent Labs Lab 05/17/14 1318 05/18/14 0620  NA 138 141  K 3.6 3.9  CL 105 111  CO2 23 22  GLUCOSE 160* 130*  BUN 19 15  CREATININE 0.87 0.74  CALCIUM 10.0 9.5  MG 1.8  --    Liver Function Tests: No results for input(s): AST, ALT, ALKPHOS, BILITOT, PROT, ALBUMIN in the last 168 hours. No results for input(s): LIPASE, AMYLASE in the last 168 hours. No results for input(s): AMMONIA in the last 168 hours. CBC:  Recent Labs Lab 05/17/14 1318 05/18/14 0620  WBC 8.7 5.4  HGB 15.4* 13.8  HCT 43.7 38.8  MCV 87.1 86.2  PLT 209 176   Cardiac Enzymes: No results for input(s): CKTOTAL, CKMB, CKMBINDEX, TROPONINI in the last 168 hours. BNP (last 3 results) No results for input(s): PROBNP in the last 8760 hours. CBG: No results for input(s): GLUCAP in the last 168 hours. No results for input(s): HGBA1C in the last 72 hours. Other Labs: Invalid input(s): POCBNP No results for input(s): DDIMER in the last 168 hours. No results for input(s): CHOL, HDL, LDLCALC, TRIG, CHOLHDL, LDLDIRECT in the last 168 hours. No results for input(s): TSH, T4TOTAL, T3FREE, FREET4, THYROIDAB in the last 168 hours.  Invalid input(s):  FREET3 No results for input(s): VITAMINB12, FOLATE, FERRITIN, TIBC, IRON, RETICCTPCT in the last 168 hours.  Micro Results: Recent Results (from the past 240 hour(s))  MRSA PCR Screening     Status: None   Collection Time: 05/18/14 10:35 AM  Result Value Ref Range Status   MRSA by PCR NEGATIVE NEGATIVE Final    Comment:        The GeneXpert MRSA Assay (FDA approved for NASAL specimens only), is one component of a comprehensive MRSA colonization surveillance program. It is not intended to diagnose MRSA infection nor to guide or monitor treatment for MRSA infections.     Studies/Results: Dg Chest 2 View  05/17/2014   CLINICAL DATA:  Initial encounter for altered mental status  EXAM: CHEST  2 VIEW  COMPARISON:  None.  FINDINGS: Stable asymmetric elevation of the left hemidiaphragm. Airspace disease seen at the right base on the previous study has resolved although the extreme right costophrenic angle is not been included on this study. The lungs are clear without focal infiltrate, edema, pneumothorax or pleural effusion. Cardiopericardial silhouette is at upper limits of normal for size. Imaged bony structures of the thorax are intact.  IMPRESSION: No active cardiopulmonary disease.   Electronically Signed   By: Kennith Center M.D.   On: 05/17/2014 14:45   Ct Head Wo Contrast  05/17/2014   CLINICAL DATA:  Acute mental status changes which began earlier today  with somnolence and unresponsiveness.  EXAM: CT HEAD WITHOUT CONTRAST  TECHNIQUE: Contiguous axial images were obtained from the base of the skull through the vertex without intravenous contrast.  COMPARISON:  12/21/2013 dating back to 04/11/2009.  FINDINGS: Severe cortical atrophy, moderate deep atrophy, and severe cerebellar atrophy, progressive since 2010. Ventricular system normal in size and appearance for age. Moderate to severe changes of small vessel disease of the white matter diffusely, progressive since 2010. Old lacunar type  strokes in the basal ganglia bilaterally, unchanged. No mass lesion. No midline shift. No acute hemorrhage or hematoma. No extra-axial fluid collections. No evidence of acute infarction. Partial empty sella.  Hyperostosis frontalis interna. No significant osseous abnormality involving the skull. Mucous retention cysts or polyps in the maxillary sinuses. Remaining visualized paranasal sinuses, bilateral mastoid air cells and bilateral middle ear cavities well aerated. Severe bilateral carotid siphon and vertebrobasilar atherosclerosis.  IMPRESSION: 1. No acute intracranial abnormality. 2. Severe generalized atrophy and moderate to severe chronic microvascular ischemic changes of the white matter diffusely, progressive since 2010. 3. Old lacunar type strokes in the basal ganglia bilaterally.   Electronically Signed   By: Hulan Saas M.D.   On: 05/17/2014 15:37    Medications: I have reviewed the patient's current medications. Scheduled Meds: . enoxaparin (LOVENOX) injection  40 mg Subcutaneous Q24H   Continuous Infusions: . dextrose 5 % and 0.9% NaCl 100 mL/hr at 05/18/14 1210   PRN Meds:.alum & mag hydroxide-simeth, ondansetron **OR** ondansetron (ZOFRAN) IV  Assessment/Plan: Principal Problem:   Encephalopathy, metabolic Active Problems:   Dehydration   Blindness   Diarrhea in adult patient  Metabolic encephalopathy Unclear etiology.  Patient is not on any clear medications that would decrease patient's mentation.  Head CT shows no acute intracranial abnormality.  Chest x-ray and urine analysis not suggestive of infectious etiology.  Continue to monitor, if patient does not improve consider further workup.  Dehydration Hydrate the patient on IV fluids.  Diarrhea Resolved.  However check stool for C. difficile, if any further episodes of diarrhea.  Hypertension Stable.  Not on any medications.  Dementia Chronic and stable.  Family Communication: Attempted to contact niece by  telephone, unavailable. CODE STATUS: Presumed full code. Disposition: Continue as inpatient.    LOS: 1 day  Cliffton Spradley A, MD 05/18/2014, 3:00 PM

## 2014-05-18 NOTE — Evaluation (Signed)
Clinical/Bedside Swallow Evaluation Patient Details  Name: Catherine Howell MRN: [id number] Date of Birth: 1930-10-27  Today's Date: 05/18/2014 Time: 9528-4132 SLP Time Calculation (min) (ACUTE ONLY): 12 min  Past Medical History:  Past Medical History  Diagnosis Date  . Blind   . Condyloma acuminatum   . 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 Hypertension   . Glaucoma   . Memory deficits 10/03/2012  . 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 Hearing deficit   . Vitamin D deficiency   . Gastroesophageal reflux disease   . COPD (chronic obstructive pulmonary disease)   . Depression    Past Surgical History:  Past Surgical History  Procedure Laterality Date  . Cholecystectomy    . Eye surgery     HPI:  Catherine Howell is an 81 y.o. female who is blind with HTN, dementia, COPD and GERD sent from the nursing home for a K of 7.5. Pt also with AMS and diarrhea.    Assessment / Plan / Recommendation Clinical Impression  Pt's current mentation limits her ability to participate in PO trials at this time. Pt with reduced oral acceptance of POs and little to no volitional manipulation of boluses. All trials provided were either anteriorly spilled or manually removed by SLP. Recommend to maintain NPO at this time, pending improvement in mentation.    Aspiration Risk  Severe    Diet Recommendation NPO   Medication Administration: Via alternative means    Other  Recommendations Oral Care Recommendations: Oral care Q4 per protocol   Follow Up Recommendations   (tbd)    Frequency and Duration min 3x week  1 week   Pertinent Vitals/Pain n/a    SLP Swallow Goals     Swallow Study Prior Functional Status       General HPI: Catherine Howell is an 81 y.o. female who is blind with HTN, dementia, COPD and GERD sent from the nursing home for a K of 7.5. Pt also with AMS and diarrhea.  Type of Study: Bedside swallow evaluation Previous Swallow  Assessment: pt with previous BSEs with signs of mild cognitively-based dysphagia however overall functional  Diet Prior to this Study: NPO Temperature Spikes Noted: No Respiratory Status: Room air History of Recent Intubation: No Behavior/Cognition: Alert;Doesn't follow directions Self-Feeding Abilities: Total assist Patient Positioning: Upright in bed Baseline Vocal Quality: Other (comment) (UTA) Volitional Cough: Cognitively unable to elicit Volitional Swallow: Unable to elicit    Oral/Motor/Sensory Function Overall Oral Motor/Sensory Function: Other (comment) (does not follow commands to assess)   Ice Chips Ice chips: Impaired Presentation: Spoon Oral Phase Impairments: Poor awareness of bolus;Other (comment) (reduced oral acceptance) Oral Phase Functional Implications: Right anterior spillage;Left anterior spillage   Thin Liquid Thin Liquid: Impaired Presentation: Spoon Oral Phase Impairments: Poor awareness of bolus Oral Phase Functional Implications: Right anterior spillage;Left anterior spillage    Nectar Thick Nectar Thick Liquid: Not tested   Honey Thick Honey Thick Liquid: Not tested   Puree Puree: Impaired Presentation: Spoon Oral Phase Impairments: Poor awareness of bolus   Solid   GO    Solid: Not tested        Maxcine Ham, M.A. CCC-SLP [phone]  Maxcine Ham 05/18/2014,4:41 PM

## 2014-05-18 NOTE — Clinical Social Work Psychosocial (Signed)
Clinical Social Work Department BRIEF PSYCHOSOCIAL ASSESSMENT 05/18/2014  Patient:  Catherine Howell, Catherine Howell     Account Number:  [id number]     Admit date:  05/17/2014  Clinical Social Worker:  Mosie Epstein  Date/Time:  05/18/2014 02:48 PM  Referred by:  Physician  Date Referred:  05/18/2014 Referred for  SNF Placement   Other Referral:   none.   Interview type:  Other - See comment Other interview type:   CSW attempted to speak with pt's niece, Bjorn Loser [phone]) but was unable to reach.    CSW spoke with admissions coordinator at [facility].    PSYCHOSOCIAL DATA Living Status:  FACILITY Admitted from facility:  [facility] Level of care:  Skilled Nursing Facility Primary support name:  Mickey Farber Primary support relationship to patient:  FAMILY Degree of support available:   Adequate support system.    CURRENT CONCERNS Current Concerns  Post-Acute Placement   Other Concerns:   none.    SOCIAL WORK ASSESSMENT / PLAN CSW received referral from RN stating pt admitted from facility. Per chart review, pt physical address at [facility].    CSW attempted to speak with pt's niece, Mickey Farber, regarding discharge disposition. CSW was unable to reach pt's niece at this time.    CSW spoke with [facility] representative who informed CSW pt is long-term resident at [facility] and [address].    CSW spoke with [facility] admissions coordinator to confirm. Per [facility] admissions coordinator, pt is able to return once medically stable for discharge.    CSW to continue to follow and assist with discharge planning needs.   Assessment/plan status:  Psychosocial Support/Ongoing Assessment of Needs Other assessment/ plan:   none.   Information/referral to community resources:   Pt to return to [facility] once medically stable for discharge.    PATIENT'S/FAMILY'S RESPONSE  TO PLAN OF CARE: CSW to follow-up regarding contacting pt's niece to discuss discharge disposition.       Marcelline Deist, LCSWA [phone]) Licensed Clinical Social Worker Orthopedics [phone]) and Surgical [phone])

## 2014-05-18 NOTE — Progress Notes (Signed)
Utilization review completed. Reshard Guillet, RN, BSN. 

## 2014-05-19 LAB — BASIC METABOLIC PANEL
Anion gap: 9 (ref 5–15)
BUN: 10 mg/dL (ref 6–23)
CO2: 20 mmol/L (ref 19–32)
Calcium: 9.3 mg/dL (ref 8.4–10.5)
Chloride: 110 mEq/L (ref 96–112)
Creatinine, Ser: 0.61 mg/dL (ref 0.50–1.10)
GFR calc Af Amer: >90 mL/min (ref >90)
GFR calc non Af Amer: 83 mL/min — ABNORMAL LOW (ref >90)
GLUCOSE: 95 mg/dL (ref 70–99)
POTASSIUM: 3.2 mmol/L — AB (ref 3.5–5.1)
Sodium: 139 mmol/L (ref 135–145)

## 2014-05-19 LAB — CBC
HCT: 37.8 % (ref 36.0–46.0)
HEMOGLOBIN: 13.1 g/dL (ref 12.0–15.0)
MCH: 30.6 pg (ref 26.0–34.0)
MCHC: 34.7 g/dL (ref 30.0–36.0)
MCV: 88.3 fL (ref 78.0–100.0)
Platelets: 166 10*3/uL (ref 150–400)
RBC: 4.28 MIL/uL (ref 3.87–5.11)
RDW: 13.7 % (ref 11.5–15.5)
WBC: 4.6 10*3/uL (ref 4.0–10.5)

## 2014-05-19 MED ORDER — DEXTROSE-NACL 5-0.9 % IV SOLN
INTRAVENOUS | Status: AC
  Administered 2014-05-19 – 2014-05-20 (×2): via INTRAVENOUS

## 2014-05-19 NOTE — Progress Notes (Signed)
OT NOTE  Ot called [facility] regarding PTA participation. Per RN Rudi CocoVanita- pt has been total (A) x2 weeks, stand pivot +2 to w/c only, occasionally requires use of lift to w/c, total (A) for feeding, total (A) for adls and therapy had terminated services in facility x2 weeks ago.   Pt is Medicare and current D/C plan is SNF. No apparent immediate acute care OT needs, therefore will defer OT to SNF. If OT eval is needed please call Acute Rehab Dept. at [phone] or text page OT at [phone].    Mateo FlowJones, Brynn   OTR/L Pager: [phone] Office: [phone] .

## 2014-05-19 NOTE — Evaluation (Addendum)
Physical Therapy Evaluation Patient Details Name: Catherine Howell MRN: [id number] DOB: 09/02/1930 Today's Date: 05/19/2014   History of Present Illness  Pt is an 81 y.o. female admitted with diarrhea and metabolic encephalopathy.  Clinical Impression  Pt admitted with above diagnosis. Pt currently with functional limitations due to the deficits listed below (see PT Problem List). Mobility limited on eval due to arousal level. Attempted to increase arousal by sitting EOB without success.  Pt required total assist.  Pt will benefit from skilled PT to increase their independence and safety with mobility to allow discharge to the venue listed below. PT to do a trial to see if pt's arousal level increases and she is better able to participate.  Prior to her 02/2014 hospitalization, she was ambulating independently.      Follow Up Recommendations SNF;Supervision/Assistance - 24 hour    Equipment Recommendations   (TBD as impoves medically)    Recommendations for Other Services       Precautions / Restrictions Precautions Precautions: Fall      Mobility  Bed Mobility Overal bed mobility: Needs Assistance Bed Mobility: Supine to Sit;Sit to Supine     Supine to sit: Total assist Sit to supine: Total assist   General bed mobility comments: Pt unable to maintain sitting balance EOB requiring return to supine.  Transfers                    Ambulation/Gait                Stairs            Wheelchair Mobility    Modified Rankin (Stroke Patients Only)       Balance Overall balance assessment: Needs assistance Sitting-balance support: No upper extremity supported;Feet unsupported Sitting balance-Leahy Scale: Zero                                       Pertinent Vitals/Pain Pain Assessment: Faces Faces Pain Scale: No hurt    Home Living Family/patient expects to be discharged to:: Assisted living               Home Equipment:  None      Prior Function Level of Independence: Needs assistance   Gait / Transfers Assistance Needed: Prior to 03/11/14 hospital admission, pt was independent with mobility, denies using assistive device, ambulates to dining hall. [facility] d/c from that hospitalization.           Hand Dominance        Extremity/Trunk Assessment   Upper Extremity Assessment: Difficult to assess due to impaired cognition           Lower Extremity Assessment: Difficult to assess due to impaired cognition (tightness noted bilat hip flexors and bilat hamstrings)         Communication   Communication: HOH;Other (comment) (blind)  Cognition Arousal/Alertness: Lethargic Behavior During Therapy: Flat affect Overall Cognitive Status: Difficult to assess                      General Comments      Exercises        Assessment/Plan    PT Assessment Patient needs continued PT services  PT Diagnosis Generalized weakness;Difficulty walking;Altered mental status   PT Problem List Decreased strength;Decreased activity tolerance;Decreased balance;Decreased mobility;Decreased coordination;Decreased cognition;Decreased safety awareness  PT  Treatment Interventions Functional mobility training;Therapeutic activities;Therapeutic exercise;Patient/family education;Cognitive remediation;Balance training   PT Goals (Current goals can be found in the Care Plan section) Acute Rehab PT Goals Patient Stated Goal: unable to state PT Goal Formulation: Patient unable to participate in goal setting Time For Goal Achievement: 06/02/14 Potential to Achieve Goals: Fair    Frequency Min 2X/week   Barriers to discharge        Co-evaluation               End of Session   Activity Tolerance: Patient limited by lethargy Patient left: in bed;with call bell/phone within reach;with bed alarm set Nurse Communication: Mobility status         Time: 4098-1191 PT  Time Calculation (min) (ACUTE ONLY): 13 min   Charges:     PT Treatments $Therapeutic Activity: 8-22 mins   PT G Codes:        Ilda Foil 05/19/2014, 10:36 AM

## 2014-05-19 NOTE — Progress Notes (Signed)
Subjective: Patient nonverbal.  Per staff still has not had any diarrhea.  Objective: Vital signs in last 24 hours: Filed Vitals:   05/18/14 0523 05/18/14 1314 05/18/14 2018 05/19/14 0550  BP: 128/79 134/79 136/78 111/83  Pulse: 68 73 69 72  Temp: 97.9 F (36.6 C) 97.5 F (36.4 C) 97.6 F (36.4 C) 97.8 F (36.6 C)  TempSrc: Oral Axillary    Resp: SpO2: 96% 95% 96% 96%   Weight change:   Intake/Output Summary (Last 24 hours) at 05/19/14 1344 Last data filed at 05/19/14 0800  Gross per 24 hour  Intake      0 ml  Output      0 ml  Net      0 ml    Physical Exam: General: Awake, No acute distress.  Nonverbal, responds to stimuli. HEENT: EOMI. Neck: Supple CV: S1 and S2 Lungs: Clear to ascultation bilaterally Abdomen: Soft, Nontender, Nondistended, +bowel sounds. Ext: Good pulses. Trace edema.  Lab Results: Basic Metabolic Panel:  Recent Labs Lab 05/17/14 1318 05/18/14 0620 05/19/14 0620  NA 138 141 139  K 3.6 3.9 3.2*  CL 105 111 110  CO2 GLUCOSE 160* 130* 95  BUN CREATININE 0.87 0.74 0.61  CALCIUM 10.0 9.5 9.3  MG 1.8  --   --    Liver Function Tests: No results for input(s): AST, ALT, ALKPHOS, BILITOT, PROT, ALBUMIN in the last 168 hours. No results for input(s): LIPASE, AMYLASE in the last 168 hours. No results for input(s): AMMONIA in the last 168 hours. CBC:  Recent Labs Lab 05/17/14 1318 05/18/14 0620 05/19/14 0620  WBC 8.7 5.4 4.6  HGB 15.4* 13.8 13.1  HCT 43.7 38.8 37.8  MCV 87.1 86.2 88.3  PLT 209 176 166   Cardiac Enzymes: No results for input(s): CKTOTAL, CKMB, CKMBINDEX, TROPONINI in the last 168 hours. BNP (last 3 results) No results for input(s): PROBNP in the last 8760 hours. CBG: No results for input(s): GLUCAP in the last 168 hours. No results for input(s): HGBA1C in the last 72 hours. Other Labs: Invalid input(s): POCBNP No results for input(s): DDIMER in the last 168 hours. No results for  input(s): CHOL, HDL, LDLCALC, TRIG, CHOLHDL, LDLDIRECT in the last 168 hours. No results for input(s): TSH, T4TOTAL, T3FREE, FREET4, THYROIDAB in the last 168 hours.  Invalid input(s): FREET3 No results for input(s): VITAMINB12, FOLATE, FERRITIN, TIBC, IRON, RETICCTPCT in the last 168 hours.  Micro Results: Recent Results (from the past 240 hour(s))  Urine culture     Status: None   Collection Time: 05/17/14  1:42 PM  Result Value Ref Range Status   Specimen Description URINE, CATHETERIZED  Final   Special Requests Normal  Final   Colony Count NO GROWTH Performed at [facility]   Final   Culture NO GROWTH Performed at [facility]   Final   Report Status 05/18/2014 FINAL  Final  MRSA PCR Screening     Status: None   Collection Time: 05/18/14 10:35 AM  Result Value Ref Range Status   MRSA by PCR NEGATIVE NEGATIVE Final    Comment:        The GeneXpert MRSA Assay (FDA approved for NASAL specimens only), is one component of a comprehensive MRSA colonization surveillance program. It is not intended to diagnose MRSA infection nor to guide or monitor treatment for MRSA infections.     Studies/Results: Dg Chest 2 View  05/17/2014   CLINICAL DATA:  Initial encounter for altered mental status  EXAM: CHEST  2 VIEW  COMPARISON:  None.  FINDINGS: Stable asymmetric elevation of the left hemidiaphragm. Airspace disease seen at the right base on the previous study has resolved although the extreme right costophrenic angle is not been included on this study. The lungs are clear without focal infiltrate, edema, pneumothorax or pleural effusion. Cardiopericardial silhouette is at upper limits of normal for size. Imaged bony structures of the thorax are intact.  IMPRESSION: No active cardiopulmonary disease.   Electronically Signed   By: Kennith Center M.D.   On: 05/17/2014 14:45   Ct Head Wo Contrast  05/17/2014   CLINICAL DATA:  Acute mental status changes which began earlier  today with somnolence and unresponsiveness.  EXAM: CT HEAD WITHOUT CONTRAST  TECHNIQUE: Contiguous axial images were obtained from the base of the skull through the vertex without intravenous contrast.  COMPARISON:  12/21/2013 dating back to 04/11/2009.  FINDINGS: Severe cortical atrophy, moderate deep atrophy, and severe cerebellar atrophy, progressive since 2010. Ventricular system normal in size and appearance for age. Moderate to severe changes of small vessel disease of the white matter diffusely, progressive since 2010. Old lacunar type strokes in the basal ganglia bilaterally, unchanged. No mass lesion. No midline shift. No acute hemorrhage or hematoma. No extra-axial fluid collections. No evidence of acute infarction. Partial empty sella.  Hyperostosis frontalis interna. No significant osseous abnormality involving the skull. Mucous retention cysts or polyps in the maxillary sinuses. Remaining visualized paranasal sinuses, bilateral mastoid air cells and bilateral middle ear cavities well aerated. Severe bilateral carotid siphon and vertebrobasilar atherosclerosis.  IMPRESSION: 1. No acute intracranial abnormality. 2. Severe generalized atrophy and moderate to severe chronic microvascular ischemic changes of the white matter diffusely, progressive since 2010. 3. Old lacunar type strokes in the basal ganglia bilaterally.   Electronically Signed   By: Hulan Saas M.D.   On: 05/17/2014 15:37    Medications: I have reviewed the patient's current medications. Scheduled Meds: . acidophilus  1 capsule Oral TID  . aspirin  81 mg Oral Q breakfast  . Cholecalciferol  1,000 Units Oral Daily  . divalproex  125 mg Oral QHS  . enoxaparin (LOVENOX) injection  40 mg Subcutaneous Q24H  . feeding supplement (ENSURE)  1 Container Oral TID WC  . loratadine  10 mg Oral Daily  . loteprednol  1 drop Left Eye Daily  . potassium chloride SA  20 mEq Oral Daily   Continuous Infusions: . dextrose 5 % and 0.9% NaCl  100 mL/hr at 05/19/14 0608   PRN Meds:.albuterol, alum & mag hydroxide-simeth, guaifenesin, ondansetron **OR** ondansetron (ZOFRAN) IV, polyvinyl alcohol, witch hazel-glycerin  Assessment/Plan: Principal Problem:   Encephalopathy, metabolic Active Problems:   Dehydration   Blindness   Diarrhea in adult patient  Metabolic encephalopathy Unclear etiology.  Patient is not on any clear medications that would decrease patient's mentation.  Chest x-ray and urine analysis not suggestive of infectious etiology.  Head CT shows no acute intracranial abnormality, but showed severe generalized atrophy and moderate to severe chronic microvascular ischemic changes of white matter diffusely.  Findings suggestive of progression of her dementia.  After extensive discussion with patient's niece, Bjorn Loser (guardian), she notes that patient has declined over the last few months suggestive of progression of her dementia. Patient's guardian, at this time would like to avoid aggressive medical workup and management including MRI and neurology consult.  Patient's guardian is interested in palliative  care/hospice after consideration independently over the last week.  Palliative care consultation requested.  Dementia Chronic, may have progressed.  Dysphagia May be due to above.  Dehydration Hydrate the patient on IV fluids.  Diarrhea Resolved.  However check stool for C. difficile, if any further episodes of diarrhea.  Hypertension Stable.  Not on any medications.  Family Communication: Attempted to contact niece by telephone, unavailable. CODE STATUS: DNR/DNI, discussed with niece Bjorn LoserRhonda Jersey(guadian) [phone] on telephone. Disposition: Continue as inpatient.    LOS: 2 days  Orville Widmann A, MD 05/19/2014, 1:44 PM

## 2014-05-19 NOTE — Clinical Social Work Note (Signed)
CSW spoke with pt's niece, Mickey FarberRhonda Freeland, who [facility].  Pt's niece expressed preference for [facility] to [facility] once medically stable for discharge.  CSW to continue to follow and assist with discharge planning needs.  Marcelline Deistmily Pope Brunty, LCSWA [phone]) Licensed Clinical Social Worker Orthopedics [phone]([id number]) and Surgical [phone]([id number])

## 2014-05-19 NOTE — Progress Notes (Signed)
Speech Language Pathology Treatment: Dysphagia  Patient Details Name: Catherine Howell MRN: [id number] DOB: 10/07/1930 Today's Date: 05/19/2014 Time: [id number] SLP Time Calculation (min) (ACUTE ONLY): 10 min  Assessment / Plan / Recommendation Clinical Impression  F/u after yesterday's swallow evaluation to determine readiness for POs.  Despite max verbal/tactile cues, remains insufficiently alert to begin POs.  Provided oral care, washing face to stimulate arousal - pt with no oral manipulation of ice chips, no oral response to oral care.  Spontaneous swallow was not elicited.  Recommend continued NPO until MS improves.     HPI HPI: Catherine Crumblernestine E Schnick is an 81 y.o. female who is blind with HTN, dementia, COPD and GERD sent from the nursing home for a K of 7.5. Pt also with AMS and diarrhea.    Pertinent Vitals Pain Assessment: Faces Faces Pain Scale: No hurt  SLP Plan  Continue with current plan of care    Recommendations Diet recommendations: NPO              Oral Care Recommendations:  (QD) Follow up Recommendations:  (tba) Plan: Continue with current plan of care   Jaziah Goeller L. Samson Fredericouture, [place] CCC/SLP Pager [phone]      Blenda MountsCouture, Blia Totman Laurice 05/19/2014, 9:46 AM

## 2014-05-20 NOTE — Progress Notes (Signed)
Speech Language Pathology Treatment: Dysphagia  Patient Details Name: Catherine Crumblernestine E Lawless MRN: [id number] DOB: 07/03/1930 Today's Date: 05/20/2014 Time: 4098-11911435-1449 SLP Time Calculation (min) (ACUTE ONLY): 14 min  Assessment / Plan / Recommendation Clinical Impression  Pt with marginally improved alertness today.  With max verbal/tactile cues, she accepted limited boluses of water, but with oral holding, poor labial seal, and delayed swallow.  When asked if she wanted more, the patient deliberately shook her head no.  Non-verbal, and did not follow commands.  Palliative consult is pending.  Pt can not reasonably eat at this time given MS.  SLP will sign off.  Please re-refer if MS improves and our services are necessary.     HPI HPI: Catherine Howell is an 81 y.o. female who is blind with HTN, dementia, COPD and GERD sent from the nursing home for a K of 7.5. Pt also with AMS and diarrhea.    Pertinent Vitals Pain Assessment: Faces Faces Pain Scale: No hurt  SLP Plan  Discharge SLP treatment due to (comment)    Recommendations Diet recommendations: NPO (pending palliative meeting)              Oral Care Recommendations: Oral care Q4 per protocol Follow up Recommendations: None Plan: Discharge SLP treatment due to (comment)    GO     Blenda Mountsouture, Hodges Treiber Laurice 05/20/2014, 2:51 PM

## 2014-05-20 NOTE — Progress Notes (Signed)
CARE MANAGEMENT NOTE 05/20/2014  Patient:  [name],Shine E   Account Number:  [id number]  Date Initiated:  05/20/2014  Documentation initiated by:  [facility]  Subjective/Objective Assessment:   metabolic encephalapothy     Action/Plan:   SNF   Anticipated DC Date:  05/21/2014   Anticipated DC Plan:  SKILLED NURSING FACILITY      DC Planning Services  CM consult      Choice offered to / List presented to:             Status of service:  Completed, signed off Medicare Important Message given?  YES (If response is "NO", the following Medicare IM given date fields will be blank) Date Medicare IM given:  05/20/2014 Medicare IM given by:  [facility]Nunzio Banet Date Additional Medicare IM given:   Additional Medicare IM given by:    Discharge Disposition:  SKILLED NURSING FACILITY  Per UR Regulation:  Reviewed for med. necessity/level of care/duration of stay  If discussed at Long Length of Stay Meetings, dates discussed:    Comments:  05/20/2014 1300 Chart reviewed. CSW following for SNF placement. Isidoro DonningAlesia Jenesys Casseus RN CCM Case Mgmt phone [phone]

## 2014-05-20 NOTE — Consult Note (Cosign Needed)
Detailed phone discussion with HCPOA for Ms. Bonillas.  Santiago Buronda Freeland, HCPOA verbalized understanding of Ms. Rossie poor prognosis.  She recalls discussion with patient regarding issues pertaining to life sustaining measures.  With limited hope for any meaningful recovery to even baseline mental and physical status Ronda's decision is to request full comfort measures for Ms. Dresch.  We fully discussed the implications of no artificial hydration/nutrition. Comfort measures with support of symptom management as she enters the terminal phases of her disease process.  Hospice philosophy and services were discussed.  Pamelia HoitRonda would like to [facility].  She is familiar with and grateful for hospice as her mother passed away on services in 2006 from pancreatic cancer.    Although this was a difficult discussion Pamelia HoitRonda feels confident that she is making the appropriate choice for her Aunt.  Emotional support provided and I will follow up with tomorrow after hospice has reviewed for eligibility determination.  Prognosis is days to weeks.    Cindra PresumeSue Ellen Nilda Keathley, RN, MSN, [facility] Palliative Integration

## 2014-05-20 NOTE — Consult Note (Signed)
1/6 Contact made over the phone with the [facility].  She is unable to make a meeting in person at the hospital regarding [name] due to a very busy work schedule.  Telephone consultation regarding palliative care, services at discharge and issues related to prognosis and symptom management will be arranged.    Phone appointment made for 730 pm this evening.  Will follow with full note regarding conversation outcome and recommendations for transition.  [name], RN, MSN, [facility] Palliative Integration

## 2014-05-20 NOTE — Progress Notes (Signed)
Utilization review complete. Anaeli Cornwall RN CCM Case Mgmt phone [phone] 

## 2014-05-20 NOTE — Progress Notes (Signed)
Subjective: Patient nonverbal.  Unchanged from yesterday.  Objective: Vital signs in last 24 hours: Filed Vitals:   05/19/14 0550 05/19/14 1400 05/19/14 2136 05/20/14 0600  BP: 111/83 118/73 133/93 117/84  Pulse: 72 65 68 88  Temp: 97.8 F (36.6 C) 98.1 F (36.7 C) 97.8 F (36.6 C) 97.8 F (36.6 C)  TempSrc:  Axillary Axillary Axillary  Resp: SpO2: 96% 100% 99% 99%   Weight change:   Intake/Output Summary (Last 24 hours) at 05/20/14 1101 Last data filed at 05/19/14 1700  Gross per 24 hour  Intake      0 ml  Output      0 ml  Net      0 ml    Physical Exam: General: Awake, No acute distress.  Nonverbal, responds to stimuli. HEENT: EOMI. Neck: Supple CV: S1 and S2 Lungs: Clear to ascultation bilaterally Abdomen: Soft, Nontender, Nondistended, +bowel sounds. Ext: Good pulses. Trace edema.  Lab Results: Basic Metabolic Panel:  Recent Labs Lab 05/17/14 1318 05/18/14 0620 05/19/14 0620  NA 138 141 139  K 3.6 3.9 3.2*  CL 105 111 110  CO2 GLUCOSE 160* 130* 95  BUN CREATININE 0.87 0.74 0.61  CALCIUM 10.0 9.5 9.3  MG 1.8  --   --    Liver Function Tests: No results for input(s): AST, ALT, ALKPHOS, BILITOT, PROT, ALBUMIN in the last 168 hours. No results for input(s): LIPASE, AMYLASE in the last 168 hours. No results for input(s): AMMONIA in the last 168 hours. CBC:  Recent Labs Lab 05/17/14 1318 05/18/14 0620 05/19/14 0620  WBC 8.7 5.4 4.6  HGB 15.4* 13.8 13.1  HCT 43.7 38.8 37.8  MCV 87.1 86.2 88.3  PLT 209 176 166   Cardiac Enzymes: No results for input(s): CKTOTAL, CKMB, CKMBINDEX, TROPONINI in the last 168 hours. BNP (last 3 results) No results for input(s): PROBNP in the last 8760 hours. CBG: No results for input(s): GLUCAP in the last 168 hours. No results for input(s): HGBA1C in the last 72 hours. Other Labs: Invalid input(s): POCBNP No results for input(s): DDIMER in the last 168 hours. No results for  input(s): CHOL, HDL, LDLCALC, TRIG, CHOLHDL, LDLDIRECT in the last 168 hours. No results for input(s): TSH, T4TOTAL, T3FREE, FREET4, THYROIDAB in the last 168 hours.  Invalid input(s): FREET3 No results for input(s): VITAMINB12, FOLATE, FERRITIN, TIBC, IRON, RETICCTPCT in the last 168 hours.  Micro Results: Recent Results (from the past 240 hour(s))  Urine culture     Status: None   Collection Time: 05/17/14  1:42 PM  Result Value Ref Range Status   Specimen Description URINE, CATHETERIZED  Final   Special Requests Normal  Final   Colony Count NO GROWTH Performed at [facility]   Final   Culture NO GROWTH Performed at [facility]   Final   Report Status 05/18/2014 FINAL  Final  MRSA PCR Screening     Status: None   Collection Time: 05/18/14 10:35 AM  Result Value Ref Range Status   MRSA by PCR NEGATIVE NEGATIVE Final    Comment:        The GeneXpert MRSA Assay (FDA approved for NASAL specimens only), is one component of a comprehensive MRSA colonization surveillance program. It is not intended to diagnose MRSA infection nor to guide or monitor treatment for MRSA infections.     Studies/Results: No results found.  Medications: I have reviewed the  patient's current medications. Scheduled Meds: . acidophilus  1 capsule Oral TID  . aspirin  81 mg Oral Q breakfast  . Cholecalciferol  1,000 Units Oral Daily  . divalproex  125 mg Oral QHS  . enoxaparin (LOVENOX) injection  40 mg Subcutaneous Q24H  . feeding supplement (ENSURE)  1 Container Oral TID WC  . loratadine  10 mg Oral Daily  . loteprednol  1 drop Left Eye Daily  . potassium chloride SA  20 mEq Oral Daily   Continuous Infusions: . dextrose 5 % and 0.9% NaCl 100 mL/hr at 05/20/14 0429   PRN Meds:.albuterol, alum & mag hydroxide-simeth, guaifenesin, ondansetron **OR** ondansetron (ZOFRAN) IV, polyvinyl alcohol, witch hazel-glycerin  Assessment/Plan: Principal Problem:   Encephalopathy,  metabolic Active Problems:   Dehydration   Blindness   Diarrhea in adult patient  Metabolic encephalopathy Unclear etiology.  Patient is not on any clear medications that would decrease patient's mentation.  Chest x-ray and urine analysis not suggestive of infectious etiology.  Head CT shows no acute intracranial abnormality, but showed severe generalized atrophy and moderate to severe chronic microvascular ischemic changes of white matter diffusely.  Findings suggestive of progression of her dementia.  Meeting between palliative care and Bjorn LoserRhonda, guardian, pending.  Disposition to be arranged after that meeting.  Dementia Chronic, may have progressed.  Dysphagia May be due to above.  Dehydration Hydrate the patient on IV fluids.  Diarrhea Resolved.  However check stool for C. difficile, if any further episodes of diarrhea.  Hypertension Stable.  Not on any medications.  Family Communication: Attempted to contact niece by telephone, unavailable. CODE STATUS: DNR/DNI, discussed with niece Bjorn LoserRhonda Jersey(guadian) [phone] on telephone. Disposition: Pending until meeting between guardian and palliative care.    LOS: 3 days  Kateri Balch A, MD 05/20/2014, 11:01 AM

## 2014-05-21 NOTE — Consult Note (Signed)
[facility] tomorrow 05/22/2014. Meeting with guardian at 7:30pm to complete registration paper work. Dr. Kern Reaponald Hertweck to assume care per Rhonda's request. Please fax discharge summary to [phone]. RN please call report to [phone]. Please arrange transport for patient to arrive as early as possible. Thank you. Forrestine Himva Mrk Buzby LCSW [phone]

## 2014-05-21 NOTE — Progress Notes (Signed)
Subjective: Patient nonverbal and resting comfortably in bed.  Objective: Vital signs in last 24 hours: Filed Vitals:   05/20/14 1355 05/20/14 2147 05/21/14 0502 05/21/14 1300  BP: 108/67 114/81 103/70 124/85  Pulse: 60 74 48 68  Temp: 98.7 F (37.1 C) 97.7 F (36.5 C) 98.4 F (36.9 C) 97.9 F (36.6 C)  TempSrc: Axillary Axillary Axillary Axillary  Resp: 18   18  SpO2: 100% 99% 100% 99%   Weight change:   Intake/Output Summary (Last 24 hours) at 05/21/14 1832 Last data filed at 05/21/14 1700  Gross per 24 hour  Intake      0 ml  Output      0 ml  Net      0 ml    Physical Exam: General: Frail elderly female patient lying comfortably in bed. Does not appear in any distress. CV: S1 and S2, RRR. No JVD, murmurs or pedal edema. Lungs: Poor inspiratory effort but Clear to ascultation bilaterally. No increased work of breathing. Abdomen: Soft, Nontender, Nondistended, +bowel sounds. Ext: Good pulses. Trace edema. CNS: Sleeping versus not responsive to touch or call.  Lab Results: Basic Metabolic Panel:  Recent Labs Lab 05/17/14 1318 05/18/14 0620 05/19/14 0620  NA 138 141 139  K 3.6 3.9 3.2*  CL 105 111 110  CO2 GLUCOSE 160* 130* 95  BUN CREATININE 0.87 0.74 0.61  CALCIUM 10.0 9.5 9.3  MG 1.8  --   --    Liver Function Tests: No results for input(s): AST, ALT, ALKPHOS, BILITOT, PROT, ALBUMIN in the last 168 hours. No results for input(s): LIPASE, AMYLASE in the last 168 hours. No results for input(s): AMMONIA in the last 168 hours. CBC:  Recent Labs Lab 05/17/14 1318 05/18/14 0620 05/19/14 0620  WBC 8.7 5.4 4.6  HGB 15.4* 13.8 13.1  HCT 43.7 38.8 37.8  MCV 87.1 86.2 88.3  PLT 209 176 166   Cardiac Enzymes: No results for input(s): CKTOTAL, CKMB, CKMBINDEX, TROPONINI in the last 168 hours. BNP (last 3 results) No results for input(s): PROBNP in the last 8760 hours. CBG: No results for input(s): GLUCAP in the last 168 hours. No  results for input(s): HGBA1C in the last 72 hours. Other Labs: Invalid input(s): POCBNP No results for input(s): DDIMER in the last 168 hours. No results for input(s): CHOL, HDL, LDLCALC, TRIG, CHOLHDL, LDLDIRECT in the last 168 hours. No results for input(s): TSH, T4TOTAL, T3FREE, FREET4, THYROIDAB in the last 168 hours.  Invalid input(s): FREET3 No results for input(s): VITAMINB12, FOLATE, FERRITIN, TIBC, IRON, RETICCTPCT in the last 168 hours.  Micro Results: Recent Results (from the past 240 hour(s))  Urine culture     Status: None   Collection Time: 05/17/14  1:42 PM  Result Value Ref Range Status   Specimen Description URINE, CATHETERIZED  Final   Special Requests Normal  Final   Colony Count NO GROWTH Performed at [facility]   Final   Culture NO GROWTH Performed at [facility]   Final   Report Status 05/18/2014 FINAL  Final  MRSA PCR Screening     Status: None   Collection Time: 05/18/14 10:35 AM  Result Value Ref Range Status   MRSA by PCR NEGATIVE NEGATIVE Final    Comment:        The GeneXpert MRSA Assay (FDA approved for NASAL specimens only), is one component of a comprehensive MRSA colonization surveillance program. It is not intended  to diagnose MRSA infection nor to guide or monitor treatment for MRSA infections.     Studies/Results: No results found.  Medications: I have reviewed the patient's current medications. Scheduled Meds: . acidophilus  1 capsule Oral TID  . aspirin  81 mg Oral Q breakfast  . Cholecalciferol  1,000 Units Oral Daily  . divalproex  125 mg Oral QHS  . enoxaparin (LOVENOX) injection  40 mg Subcutaneous Q24H  . feeding supplement (ENSURE)  1 Container Oral TID WC  . loratadine  10 mg Oral Daily  . loteprednol  1 drop Left Eye Daily  . potassium chloride SA  20 mEq Oral Daily   Continuous Infusions:   PRN Meds:.albuterol, alum & mag hydroxide-simeth, guaifenesin, ondansetron **OR** ondansetron (ZOFRAN) IV,  polyvinyl alcohol, witch hazel-glycerin  Assessment/Plan: Principal Problem:   Encephalopathy, metabolic Active Problems:   Dehydration   Blindness   Diarrhea in adult patient  Metabolic encephalopathy Unclear etiology.  Patient is not on any clear medications that would decrease patient's mentation.  Chest x-ray and urine analysis not suggestive of infectious etiology.  Head CT shows no acute intracranial abnormality, but showed severe generalized atrophy and moderate to severe chronic microvascular ischemic changes of white matter diffusely.  Findings suggestive of progression of her dementia.  Palliative care team discussed with patient's health care power of attorney Ms. Santiago Buronda Freeland on 1/6 and decision made to transition to full comfort care. Awaiting bed availability at a residential hospice for transfer.  Dementia Chronic, may have progressed.  Dysphagia May be due to above.  Dehydration Hydrate the patient on IV fluids.  Diarrhea Resolved.  However check stool for C. difficile, if any further episodes of diarrhea.  Hypertension Stable.  Not on any medications.  Family Communication: None at bedside. CODE STATUS: DNR/DNI. Disposition: Awaiting bed at residential hospice as per d/w case management.   Time spent: 20 minutes  Catherine Louk, MD, FACP, FHM. [facility] Pager [phone]  If 7PM-7AM, please contact night-coverage [email or web address] Password [email or web address] 05/21/2014, 6:38 PM   LOS: 4 days

## 2014-05-21 NOTE — Clinical Social Work Note (Signed)
CSW received referral for residential hospice placement at time of discharge. CSW completed appropriate referrals to residential hospice.  [facility] admissions liaison currently reviewing pt's clinical information. [facility].  CSW to continue to follow and assist with discharge planning needs.  Marcelline Deistmily Ronda Rajkumar, LCSWA [phone]) Licensed Clinical Social Worker Orthopedics [phone]([id number]) and Surgical [phone]([id number])

## 2014-05-22 DIAGNOSIS — F0391 Unspecified dementia with behavioral disturbance: Secondary | ICD-10-CM

## 2014-05-22 MED ORDER — MORPHINE SULFATE (CONCENTRATE) 10 MG /0.5 ML PO SOLN: 10.0000 mg | Freq: Four times a day (QID) | ORAL | Status: AC | PRN

## 2014-05-22 MED ORDER — PROMETHAZINE HCL 12.5 MG PO TABS: 12.5000 mg | ORAL_TABLET | Freq: Four times a day (QID) | ORAL | Status: AC | PRN

## 2014-05-22 NOTE — Progress Notes (Signed)
Met with Elisabeth Cara, niece of patient and also POA.  She is having patient moved to [facility] 05/22/14 in the AM.  Patient was talking and knew who Suanne Marker was briefly tonight.

## 2014-05-22 NOTE — Discharge Summary (Signed)
Discharge Summary  Catherine Howell:[id number] DOB: 1930/08/31  PCP: Kaleen Mask, MD  Admit date: 05/17/2014 Discharge date: 05/22/2014  Time spent: 25 minutes  Recommendations for Outpatient Follow-up:  1. Patient being discharged to [facility] home 2. Patient's previous medications are being discontinued as she is transitioned to hospice with the exception of Depakote and when necessary albuterol 3. New medication: Phenergan 12.5 mg every 6 hours as needed for nausea 4. New medication: Roxanol 0.5 ML's as needed for pain  Discharge Diagnoses:  Active Hospital Problems   Diagnosis Date Noted  . Encephalopathy, metabolic 12/11/2013  . Diarrhea in adult patient 05/17/2014  . Blindness   . Dehydration 12/11/2013    Resolved Hospital Problems   Diagnosis Date Noted Date Resolved  . Diarrhea 05/17/2014 05/17/2014    Discharge Condition: Going to hospice home  Diet recommendation: Comfort feeds  Filed Vitals:   05/22/14 0538  BP: 128/79  Pulse: 55  Temp: 97.9 F (36.6 C)  Resp: 14    History of present illness:  81 year old female with past history of blindness, dementia, COPD said from her skilled nursing facility with a reported hyperkalemia of 7.5. Upon arrival to the emergency room, potassium level was normal. However, patient has been having severe diarrhea at the nursing home and her baseline is usually alert and able to answer questions, but upon arrival to the emergency room she was not arousable.  Hospital Course:  Principal Problem:   Encephalopathy, metabolic: Unclear etiology. Patient not on any medications that would decrease mentation. Chest x-ray and urinalysis were not suggestive of infectious etiology. CT scan of head noted no acute intracranial abnormality, but noted severe generalized atrophy and moderate to severe chronic microvascular ischemic changes of the white matter. All of these findings were suggestive of significant  progression of her dementia to end-stage. Palliative care consulted and after discussion with patient's healthcare per returning, the patient's niece, decision was made to change patient to full comfort care. Patient was accepted for bed at [facility] where she is going as of 05/22/58 Active Problems:   Dehydration: Treated with IV fluids   Blindness   Diarrhea in adult patient: Resolved following admission. C. difficile negative.   Procedures:  None  Consultations:  Palliative care  Discharge Exam: BP 128/79 mmHg  Pulse 55  Temp(Src) 97.9 F (36.6 C) (Axillary)  Resp 14  SpO2 100%  General: Not responsive Cardiovascular: Regular rate and rhythm, S1-S2 Respiratory: Clear to auscultation bilaterally  Discharge Instructions You were cared for by a hospitalist during your hospital stay. If you have any questions about your discharge medications or the care you received while you were in the hospital after you are discharged, you can call the unit and asked to speak with the hospitalist on call if the hospitalist that took care of you is not available. Once you are discharged, your primary care physician will handle any further medical issues. Please note that NO REFILLS for any discharge medications will be authorized once you are discharged, as it is imperative that you return to your primary care physician (or establish a relationship with a primary care physician if you do not have one) for your aftercare needs so that they can reassess your need for medications and monitor your lab values.  Discharge Instructions    Diet general    Complete by:  As directed      Increase activity slowly    Complete by:  As directed  Medication List    STOP taking these medications        acetaminophen 325 MG tablet  Commonly known as:  TYLENOL     acetaminophen 500 MG tablet  Commonly known as:  TYLENOL     alum & mag hydroxide-simeth 200-200-20 MG/5ML suspension    Commonly known as:  MAALOX/MYLANTA     aspirin 81 MG chewable tablet     D3-1000 1000 UNITS tablet  Generic drug:  Cholecalciferol     dextrose 5 % and 0.45% NaCl infusion     feeding supplement (ENSURE) Pudg     guaifenesin 100 MG/5ML syrup  Commonly known as:  ROBITUSSIN     ISOPTO TEARS 0.5 % Soln  Generic drug:  Hypromellose     lactobacillus acidophilus Tabs tablet     levofloxacin 750 MG tablet  Commonly known as:  LEVAQUIN     loperamide 2 MG capsule  Commonly known as:  IMODIUM     loratadine 10 MG tablet  Commonly known as:  CLARITIN     loteprednol 0.5 % ophthalmic suspension  Commonly known as:  LOTEMAX     magnesium hydroxide 400 MG/5ML suspension  Commonly known as:  MILK OF MAGNESIA     meclizine 25 MG tablet  Commonly known as:  ANTIVERT     neomycin-bacitracin-polymyxin ointment  Commonly known as:  NEOSPORIN     phenylephrine-shark liver oil-mineral oil-petrolatum 0.25-3-14-71.9 % rectal ointment  Commonly known as:  PREPARATION H     potassium chloride SA 20 MEQ tablet  Commonly known as:  K-DUR,KLOR-CON     PRESCRIPTION MEDICATION     sodium phosphate 7-19 GM/118ML Enem     sulfamethoxazole-trimethoprim 800-160 MG per tablet  Commonly known as:  BACTRIM DS      TAKE these medications        divalproex 125 MG capsule  Commonly known as:  DEPAKOTE SPRINKLE  Take 125 mg by mouth at bedtime.     morphine CONCENTRATE 10 mg / 0.5 ml concentrated solution  Take 0.5 mLs (10 mg total) by mouth every 6 (six) hours as needed for severe pain.     PROAIR HFA 108 (90 BASE) MCG/ACT inhaler  Generic drug:  albuterol  Inhale 2 puffs into the lungs every 4 (four) hours as needed for wheezing.     promethazine 12.5 MG tablet  Commonly known as:  PHENERGAN  Take 1 tablet (12.5 mg total) by mouth every 6 (six) hours as needed for nausea or vomiting.       No Known Allergies    The results of significant diagnostics from this hospitalization  (including imaging, microbiology, ancillary and laboratory) are listed below for reference.    Significant Diagnostic Studies: Dg Chest 2 View  05/17/2014   CLINICAL DATA:  Initial encounter for altered mental status  EXAM: CHEST  2 VIEW  COMPARISON:  None.  FINDINGS: Stable asymmetric elevation of the left hemidiaphragm. Airspace disease seen at the right base on the previous study has resolved although the extreme right costophrenic angle is not been included on this study. The lungs are clear without focal infiltrate, edema, pneumothorax or pleural effusion. Cardiopericardial silhouette is at upper limits of normal for size. Imaged bony structures of the thorax are intact.  IMPRESSION: No active cardiopulmonary disease.   Electronically Signed   By: Kennith CenterEric  Mansell M.D.   On: 05/17/2014 14:45   Ct Head Wo Contrast  05/17/2014   CLINICAL DATA:  Acute mental status changes  which began earlier today with somnolence and unresponsiveness.  EXAM: CT HEAD WITHOUT CONTRAST  TECHNIQUE: Contiguous axial images were obtained from the base of the skull through the vertex without intravenous contrast.  COMPARISON:  12/21/2013 dating back to 04/11/2009.  FINDINGS: Severe cortical atrophy, moderate deep atrophy, and severe cerebellar atrophy, progressive since 2010. Ventricular system normal in size and appearance for age. Moderate to severe changes of small vessel disease of the white matter diffusely, progressive since 2010. Old lacunar type strokes in the basal ganglia bilaterally, unchanged. No mass lesion. No midline shift. No acute hemorrhage or hematoma. No extra-axial fluid collections. No evidence of acute infarction. Partial empty sella.  Hyperostosis frontalis interna. No significant osseous abnormality involving the skull. Mucous retention cysts or polyps in the maxillary sinuses. Remaining visualized paranasal sinuses, bilateral mastoid air cells and bilateral middle ear cavities well aerated. Severe bilateral  carotid siphon and vertebrobasilar atherosclerosis.  IMPRESSION: 1. No acute intracranial abnormality. 2. Severe generalized atrophy and moderate to severe chronic microvascular ischemic changes of the white matter diffusely, progressive since 2010. 3. Old lacunar type strokes in the basal ganglia bilaterally.   Electronically Signed   By: Hulan Saas M.D.   On: 05/17/2014 15:37    Microbiology: Recent Results (from the past 240 hour(s))  Urine culture     Status: None   Collection Time: 05/17/14  1:42 PM  Result Value Ref Range Status   Specimen Description URINE, CATHETERIZED  Final   Special Requests Normal  Final   Colony Count NO GROWTH Performed at [facility]   Final   Culture NO GROWTH Performed at [facility]   Final   Report Status 05/18/2014 FINAL  Final  MRSA PCR Screening     Status: None   Collection Time: 05/18/14 10:35 AM  Result Value Ref Range Status   MRSA by PCR NEGATIVE NEGATIVE Final    Comment:        The GeneXpert MRSA Assay (FDA approved for NASAL specimens only), is one component of a comprehensive MRSA colonization surveillance program. It is not intended to diagnose MRSA infection nor to guide or monitor treatment for MRSA infections.      Labs: Basic Metabolic Panel:  Recent Labs Lab 05/17/14 1318 05/18/14 0620 05/19/14 0620  NA 138 141 139  K 3.6 3.9 3.2*  CL 105 111 110  CO2 GLUCOSE 160* 130* 95  BUN CREATININE 0.87 0.74 0.61  CALCIUM 10.0 9.5 9.3  MG 1.8  --   --    Liver Function Tests: No results for input(s): AST, ALT, ALKPHOS, BILITOT, PROT, ALBUMIN in the last 168 hours. No results for input(s): LIPASE, AMYLASE in the last 168 hours. No results for input(s): AMMONIA in the last 168 hours. CBC:  Recent Labs Lab 05/17/14 1318 05/18/14 0620 05/19/14 0620  WBC 8.7 5.4 4.6  HGB 15.4* 13.8 13.1  HCT 43.7 38.8 37.8  MCV 87.1 86.2 88.3  PLT 209 176 166   Cardiac Enzymes: No  results for input(s): CKTOTAL, CKMB, CKMBINDEX, TROPONINI in the last 168 hours. BNP: BNP (last 3 results) No results for input(s): PROBNP in the last 8760 hours. CBG: No results for input(s): GLUCAP in the last 168 hours.     Signed:  Hollice Espy  [facility] 05/22/2014, 10:49 AM

## 2014-05-22 NOTE — Clinical Social Work Note (Signed)
[facility]. Pt's niece, Bjorn LoserRhonda, updated regarding discharge.  [facility] Report number: [id number] Transportation: EMS ([address])  Marcelline Deistmily Aries Townley, LCSWA [phone]) Licensed Clinical Social Worker Orthopedics [phone](5N17-32) and Surgical [phone]([id number])

## 2014-06-15 DEATH — deceased

## 2015-04-30 IMAGING — CR DG CHEST 2V
2 series · 2 of 2 positions shown · non-contrast
Comparison: None.

CLINICAL DATA: Initial encounter for altered mental status

EXAM:
CHEST  2 VIEW

[chest lat]
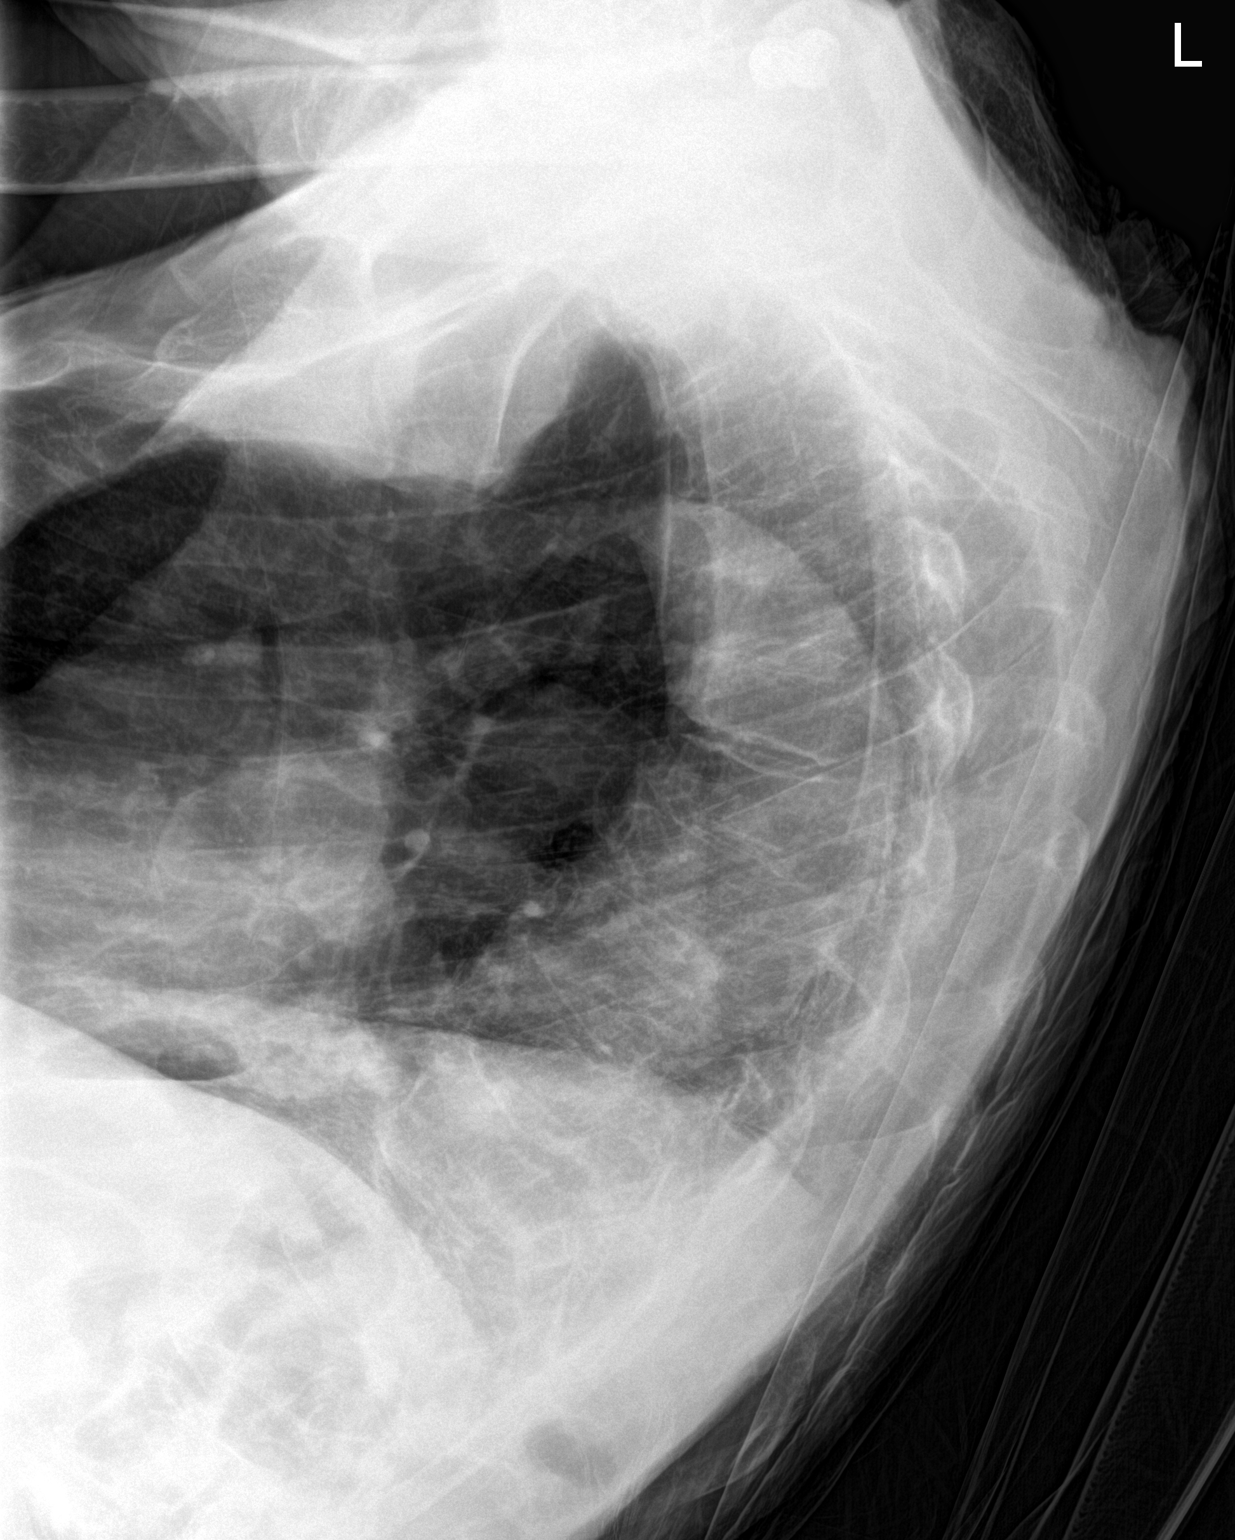

[chest ap]
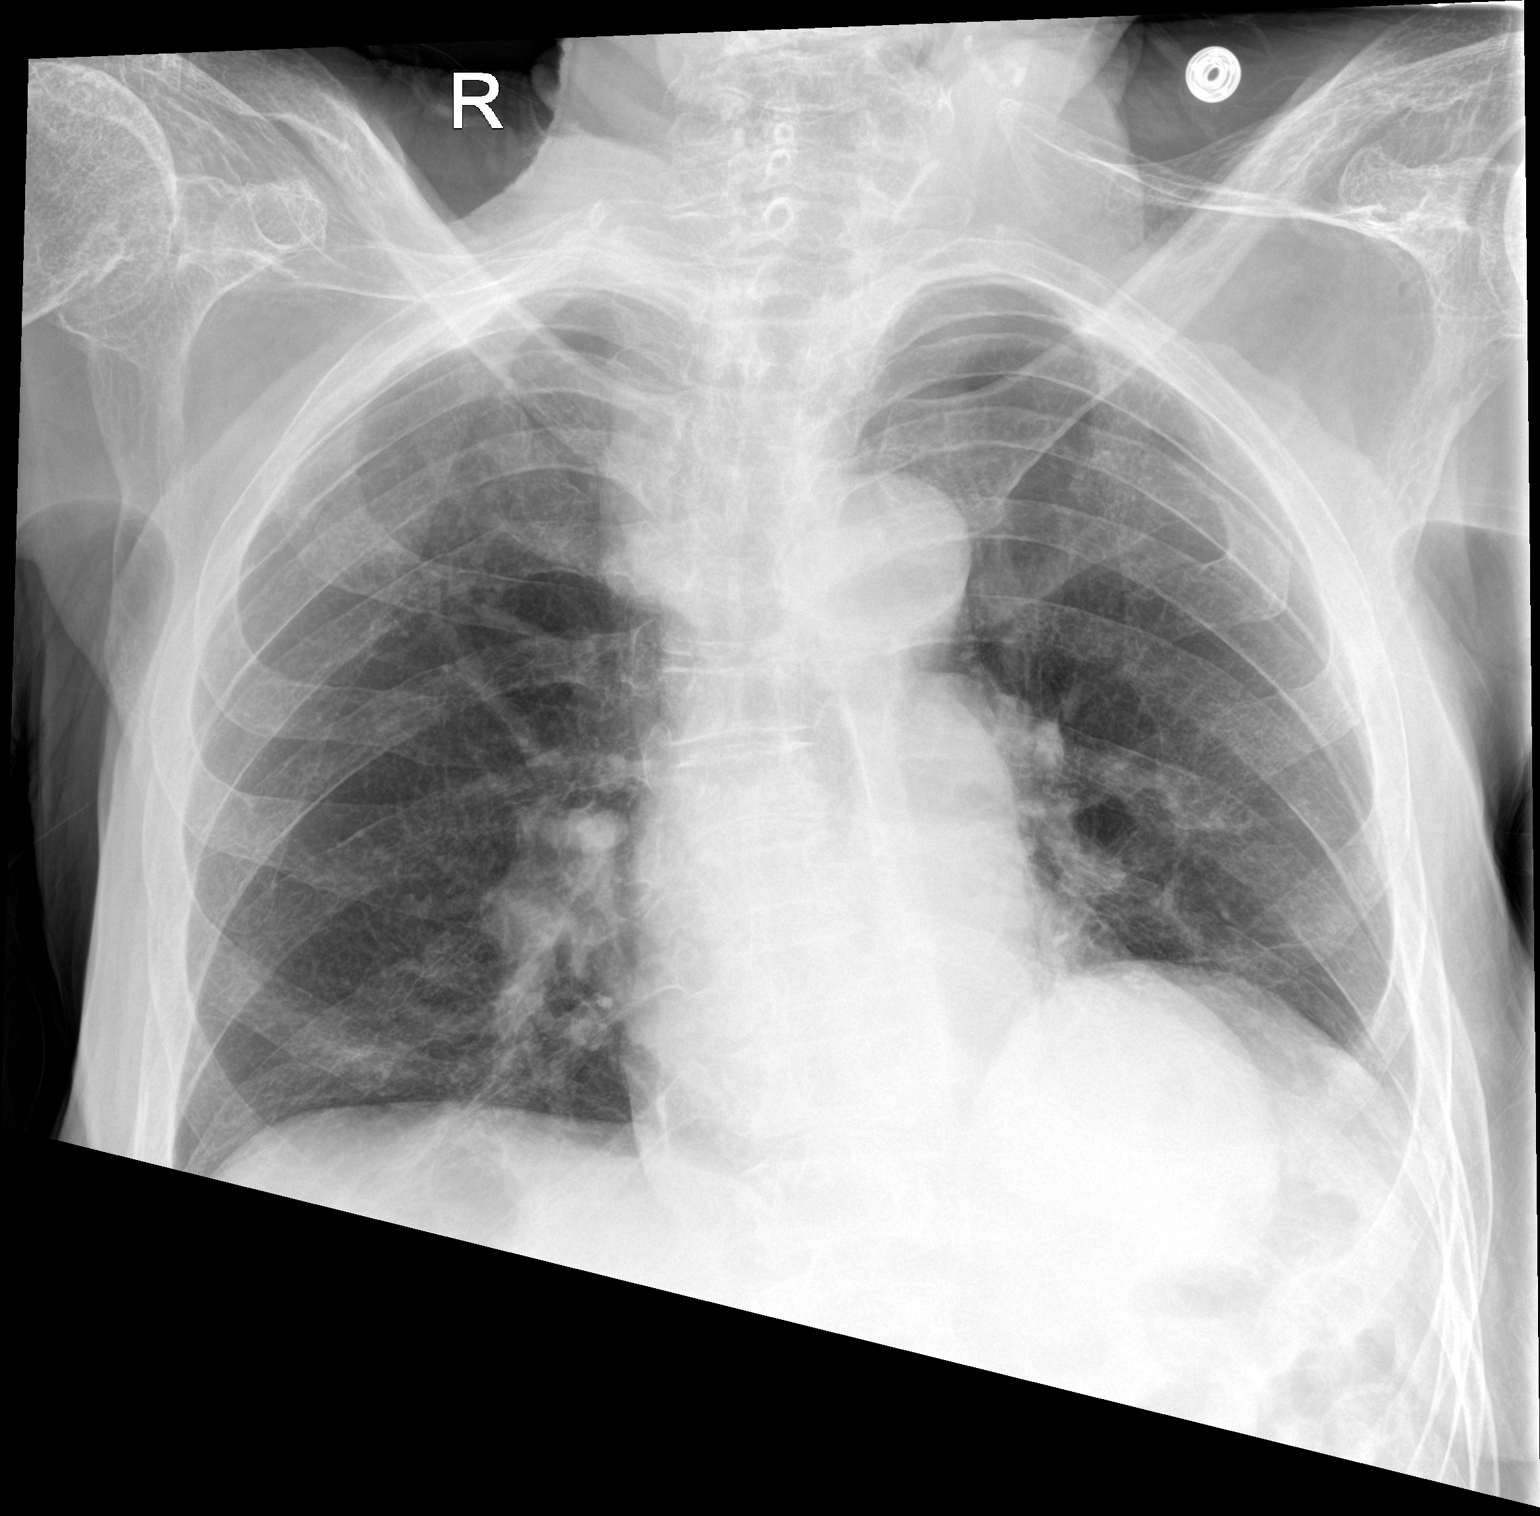

[2 of 2 positions shown; findings below may reference images not displayed]

FINDINGS: Stable asymmetric elevation of the left hemidiaphragm. Airspace
disease seen at the right base on the previous study has resolved
although the extreme right costophrenic angle is not been included
on this study. The lungs are clear without focal infiltrate, edema,
pneumothorax or pleural effusion. Cardiopericardial silhouette is at
upper limits of normal for size. Imaged bony structures of the
thorax are intact.
IMPRESSION: No active cardiopulmonary disease.

## 2015-04-30 IMAGING — CT CT HEAD W/O CM
2 series · 15 of 30 positions shown, 19 images · non-contrast
Comparison: 12/21/2013 dating back to 04/11/2009.

CLINICAL DATA: Acute mental status changes which began earlier
today with somnolence and unresponsiveness.

EXAM:
CT HEAD WITHOUT CONTRAST
TECHNIQUE: Contiguous axial images were obtained from the base of the skull
through the vertex without intravenous contrast.

[Series 201: head w/o, idose (1) · axial · non-contrast · 0.46mm/px · z∈[+69,+199]mm · 13 of 32 slices shown, 17 images]
[im 3/32  brain]
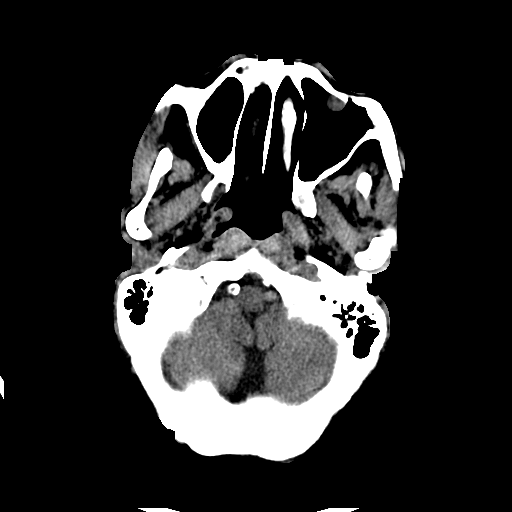
[im 3/32  bone]
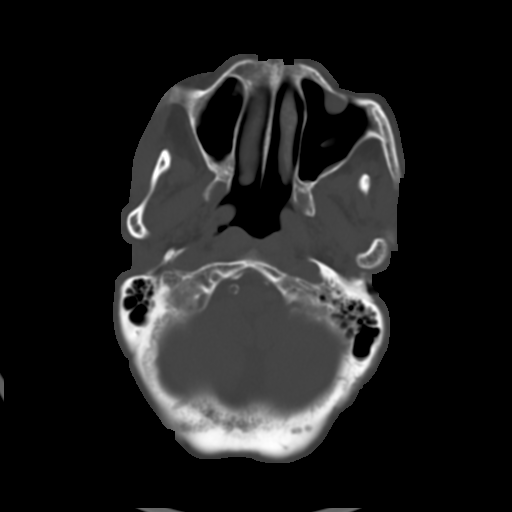
[im 5/32  brain]
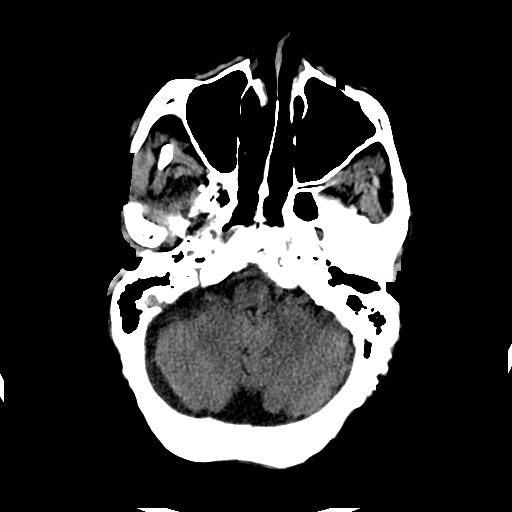
[im 7/32  brain]
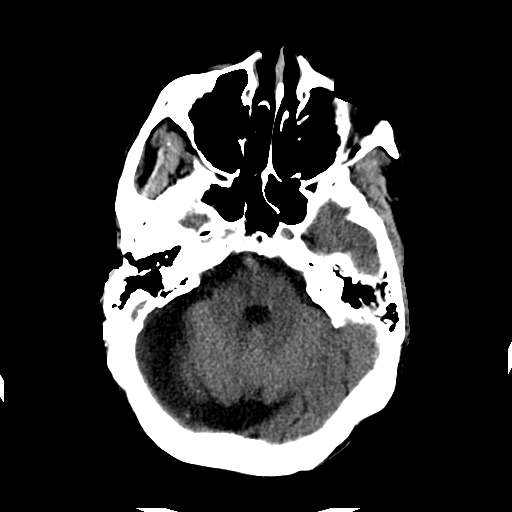
[im 9/32  brain]
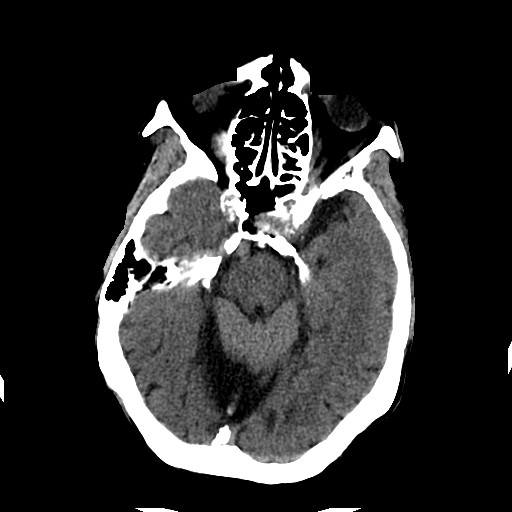
[im 12/32  brain]
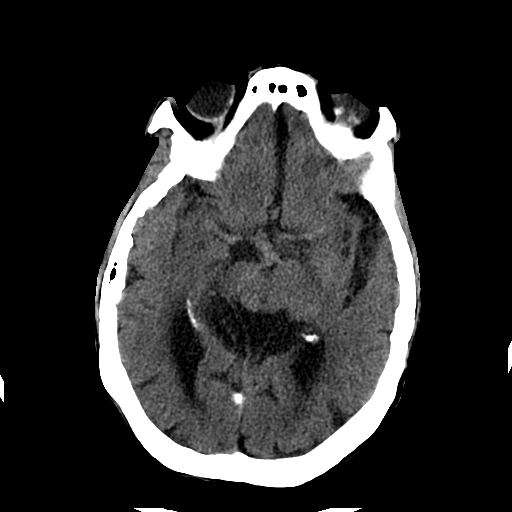
[im 12/32  bone]
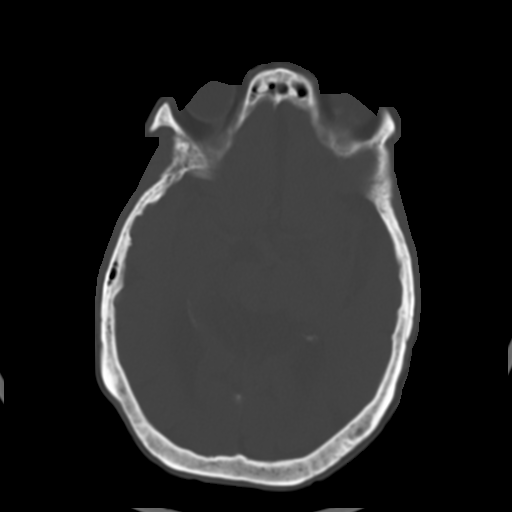
[im 14/32  brain]
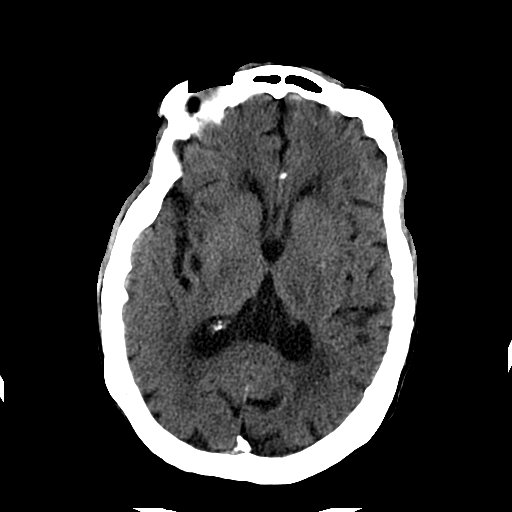
[im 16/32  brain]
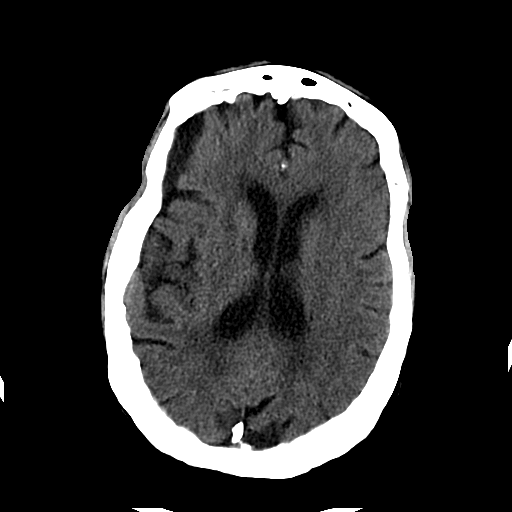
[im 18/32  brain]
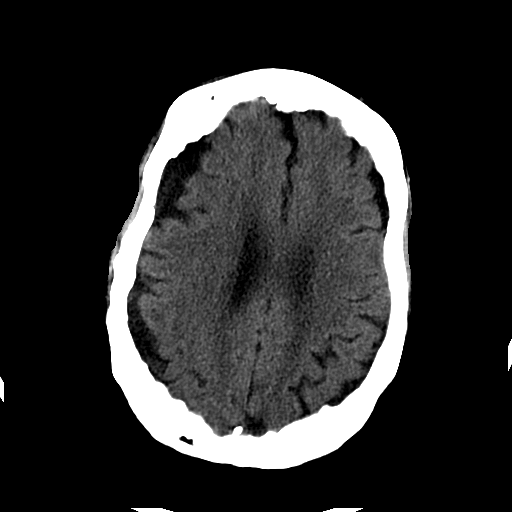
[im 20/32  brain]
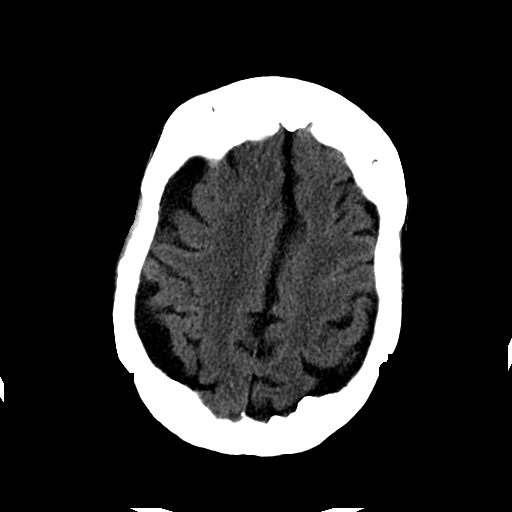
[im 20/32  bone]
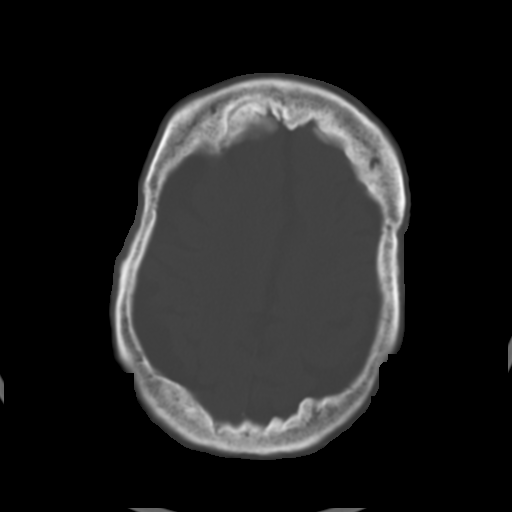
[im 23/32  brain]
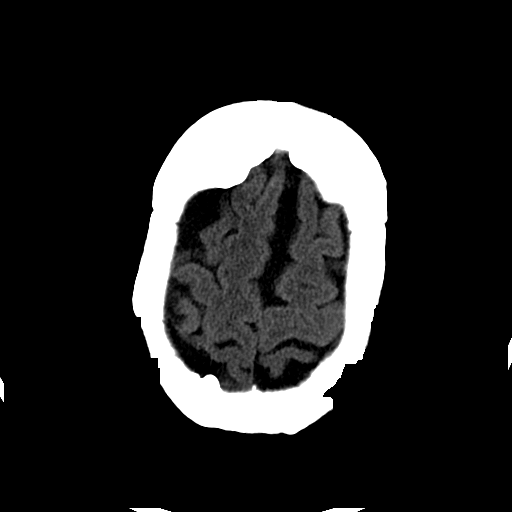
[im 25/32  brain]
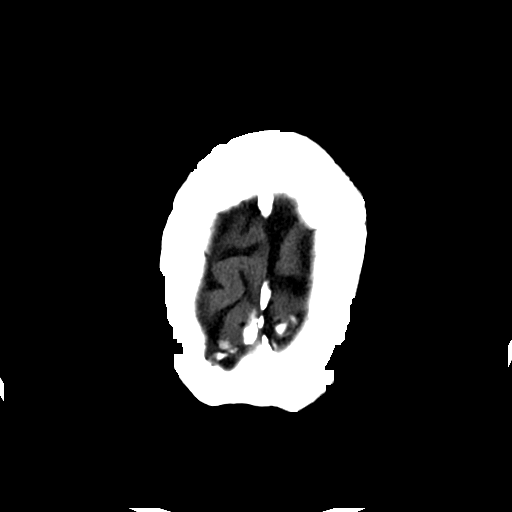
[im 27/32  brain]
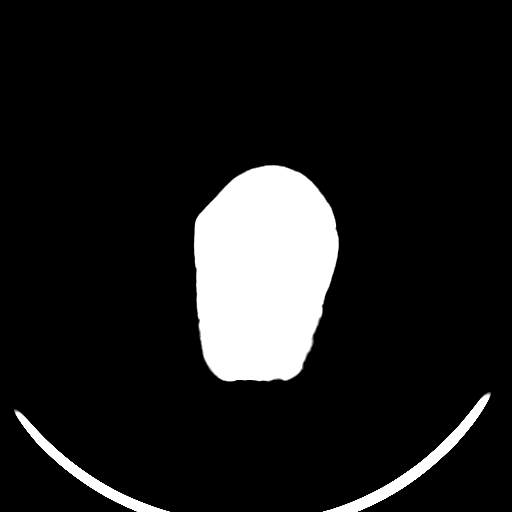
[im 29/32  brain]
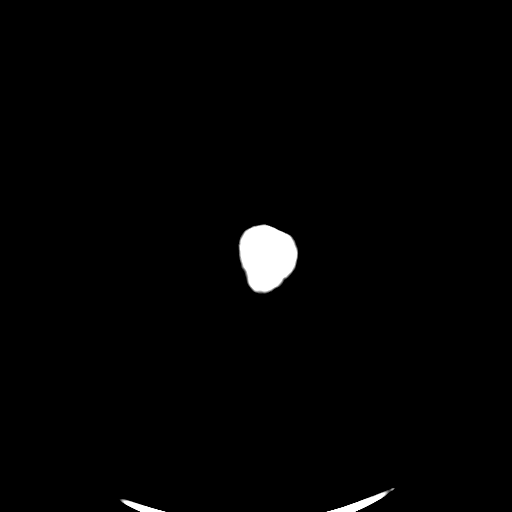
[im 29/32  bone]
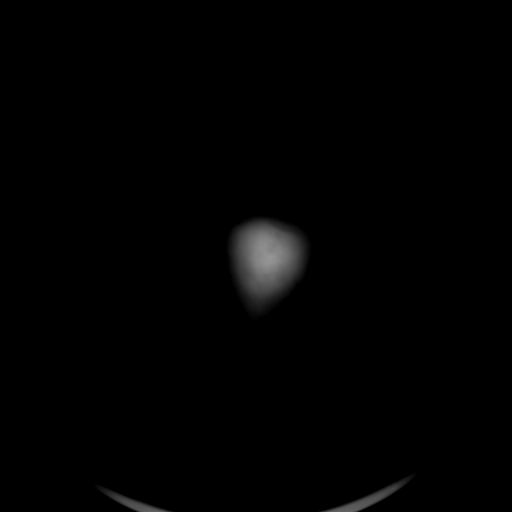

[Series 202: head w/o bone, idose (1) · axial · non-contrast · 0.46mm/px · z∈[+69,+89]mm · 2 of 32 slices shown]
[im 3/32  bone]
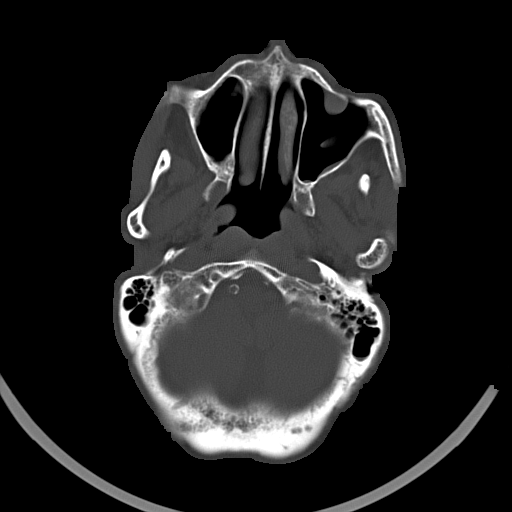
[im 7/32  bone]
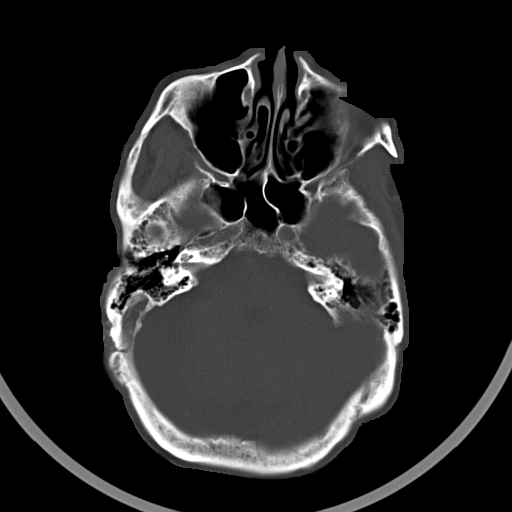

[15 of 30 positions shown; findings below may reference images not displayed]

FINDINGS: Severe cortical atrophy, moderate deep atrophy, and severe
cerebellar atrophy, progressive since 1949. Ventricular system
normal in size and appearance for age. Moderate to severe changes of
small vessel disease of the white matter diffusely, progressive
since [AGE] lacunar type strokes in the basal ganglia
bilaterally, unchanged. No mass lesion. No midline shift. No acute
hemorrhage or hematoma. No extra-axial fluid collections. No
evidence of acute infarction. Partial empty sella.

Hyperostosis frontalis interna. No significant osseous abnormality
involving the skull. Mucous retention cysts or polyps in the
maxillary sinuses. Remaining visualized paranasal sinuses, bilateral
mastoid air cells and bilateral middle ear cavities well aerated.
Severe bilateral carotid siphon and vertebrobasilar atherosclerosis.
IMPRESSION: 1. No acute intracranial abnormality.
2. Severe generalized atrophy and moderate to severe chronic
microvascular ischemic changes of the white matter diffusely,
progressive since [DATE]. Old lacunar type strokes in the basal ganglia bilaterally.
